# Patient Record
Sex: Male | Born: 1970 | Race: White | Hispanic: No | Marital: Married | Smoking: Former smoker
Health system: Southern US, Community
[De-identification: ages and names within clinical notes are randomized; demographics above are authoritative.]

## PROBLEM LIST (undated history)

## (undated) DIAGNOSIS — Z8679 Personal history of other diseases of the circulatory system: Secondary | ICD-10-CM

## (undated) DIAGNOSIS — Z87820 Personal history of traumatic brain injury: Secondary | ICD-10-CM

## (undated) DIAGNOSIS — K219 Gastro-esophageal reflux disease without esophagitis: Secondary | ICD-10-CM

## (undated) DIAGNOSIS — L509 Urticaria, unspecified: Secondary | ICD-10-CM

## (undated) DIAGNOSIS — R319 Hematuria, unspecified: Secondary | ICD-10-CM

## (undated) DIAGNOSIS — N2889 Other specified disorders of kidney and ureter: Secondary | ICD-10-CM

## (undated) DIAGNOSIS — Z973 Presence of spectacles and contact lenses: Secondary | ICD-10-CM

## (undated) DIAGNOSIS — Z8249 Family history of ischemic heart disease and other diseases of the circulatory system: Secondary | ICD-10-CM

## (undated) HISTORY — DX: Urticaria, unspecified: L50.9

## (undated) HISTORY — PX: TRANSTHORACIC ECHOCARDIOGRAM: SHX275

## (undated) HISTORY — DX: Family history of ischemic heart disease and other diseases of the circulatory system: Z82.49

---
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
## 1999-09-10 HISTORY — PX: OTHER SURGICAL HISTORY: SHX169

## 2003-02-04 ENCOUNTER — Encounter: Payer: Self-pay | Admitting: *Deleted

## 2003-02-04 ENCOUNTER — Encounter: Admission: RE | Admit: 2003-02-04 | Discharge: 2003-02-04 | Payer: Self-pay | Admitting: *Deleted

## 2003-05-09 ENCOUNTER — Encounter: Admission: RE | Admit: 2003-05-09 | Discharge: 2003-05-09 | Payer: Self-pay | Admitting: *Deleted

## 2003-05-09 ENCOUNTER — Encounter: Payer: Self-pay | Admitting: *Deleted

## 2003-11-24 ENCOUNTER — Ambulatory Visit (HOSPITAL_COMMUNITY): Admission: RE | Admit: 2003-11-24 | Discharge: 2003-11-24 | Payer: Self-pay | Admitting: Family Medicine

## 2009-10-03 ENCOUNTER — Encounter: Admission: RE | Admit: 2009-10-03 | Discharge: 2009-10-03 | Payer: Self-pay | Admitting: Family Medicine

## 2014-04-05 ENCOUNTER — Ambulatory Visit (INDEPENDENT_AMBULATORY_CARE_PROVIDER_SITE_OTHER): Payer: Federal, State, Local not specified - PPO | Admitting: Cardiovascular Disease

## 2014-04-05 ENCOUNTER — Encounter: Payer: Self-pay | Admitting: Cardiovascular Disease

## 2014-04-05 VITALS — BP 118/80 | HR 66 | Ht 70.0 in | Wt 142.9 lb

## 2014-04-05 DIAGNOSIS — R002 Palpitations: Secondary | ICD-10-CM

## 2014-04-05 DIAGNOSIS — R0789 Other chest pain: Secondary | ICD-10-CM

## 2014-04-05 NOTE — Progress Notes (Signed)
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 He was referred by his primary care physician for evaluation of palpitations. He has no cardiac risk factors. He does have a brother who had WPW and ablation at [place] at age 62. He has never had a heart attack or stroke. He did have palpitations for years ago and saw a physician at a walk-in clinic the PVCs and asked him to reduce his caffeine intake from 4 Cup a Day. This Resulted in Improvement in His Symptoms. Her Last Week She's Noticed a Fluttering in His Chest X-Ray One to 2 Times A Day Lasting 5-15 Seconds at a Time Associated with Atypical Chest Pain to the Right of the Sternum after Some of These Episodes   No current outpatient prescriptions on file.   No current facility-administered medications for this visit.    Not on File  History   Social History  . Marital Status: Single    Spouse Name: N/A    Number of Children: N/A  . Years of Education: N/A   Occupational History  . Not on file.   Social History Main Topics  . Smoking status: Former Smoker -- 9 years    Types: Cigarettes  . Smokeless tobacco: Former Systems developer    Types: Snuff     Comment: QUIT USING [facility]  6 years ago  . Alcohol Use: 5.0 oz/week    10 drink(s) per week  . Drug Use: No  . Sexual Activity: Not on file   Other Topics Concern  . Not on file   Social History Narrative  . No narrative on file     Review of Systems: General: negative for chills, fever, night sweats or weight changes.  Cardiovascular: negative for chest pain, dyspnea on exertion, edema, orthopnea, palpitations, paroxysmal nocturnal dyspnea or shortness of breath Dermatological: negative for  rash Respiratory: negative for cough or wheezing Urologic: negative for hematuria Abdominal: negative for nausea, vomiting, diarrhea, bright red blood per rectum, melena, or hematemesis Neurologic: negative for visual changes, syncope, or dizziness All other systems reviewed and are otherwise negative except as noted above.    Blood pressure 118/80, pulse 66, height 5\' 10"  (1.778 m), weight 142 lb 14.4 oz (64.819 kg).  General appearance: alert and no distress Neck: no adenopathy, no carotid bruit, no JVD, supple, symmetrical, trachea midline and thyroid not enlarged, symmetric, no tenderness/mass/nodules Lungs: clear to auscultation bilaterally Heart: regular rate and rhythm, S1, S2 normal, no murmur, click, rub or gallop Extremities: extremities normal, atraumatic, no cyanosis or edema and 2+ pedal pulses bilaterally  EKG normal sinus rhythm at 66 without ST or T wave changes. Specifically. There is no evidence of preexcitation  ASSESSMENT AND PLAN:   Palpitations The patient was referred by Raymond Dominguez from [place] for evaluation of palpitations. He had palpitations about 6 weeks ago and saw a physician at a walk-in clinic who told him to reduce his caffeine intake and take him for workup to one cup a day. The symptoms resolved. Over last 2 weeks she's noticed increasing frequency of palpitations which she says are different than his "PVCs". He is building a house and is due to close in 2 weeks. He  drinks one cup of coffee a day and 45-40 alcoholic beverages a week. Contributed to the echocardiogram and a one-month event monitor. I've asked him to reduce his cath caffeine intake and alcohol intake as well. He does say that during this episode which lasted 5-10 seconds at a time 1-3 times a today he gets brief atypical chest pain to the right of the sternum lasting several seconds.      Raymond Harp MD FACP,FACC,FAHA, [facility] 04/05/2014 4:38 PM

## 2014-04-05 NOTE — Patient Instructions (Signed)
Your physician has recommended that you wear an event monitor. Event monitors are medical devices that record the heart's electrical activity. Doctors most often [place] these monitors to diagnose arrhythmias. Arrhythmias are problems with the speed or rhythm of the heartbeat. The monitor is a small, portable device. You can wear one while you do your normal daily activities. This is usually used to diagnose what is causing palpitations/syncope (passing out).   Your physician has requested that you have an echocardiogram. Echocardiography is a painless test that uses sound waves to create images of your heart. It provides your doctor with information about the size and shape of your heart and how well your heart's chambers and valves are working. This procedure takes approximately one hour. There are no restrictions for this procedure.   Your physician recommends that you schedule a follow-up appointment in: 6 WEEKS

## 2014-04-05 NOTE — Assessment & Plan Note (Signed)
The patient was referred by Dr. Hosie Poisson from [place] for evaluation of palpitations. He had palpitations about 6 weeks ago and saw a physician at a walk-in clinic who told him to reduce his caffeine intake and take him for workup to one cup a day. The symptoms resolved. Over last 2 weeks she's noticed increasing frequency of palpitations which she says are different than his "PVCs". He is building a house and is due to close in 2 weeks. He drinks one cup of coffee a day and 92-92 alcoholic beverages a week. Contributed to the echocardiogram and a one-month event monitor. I've asked him to reduce his cath caffeine intake and alcohol intake as well. He does say that during this episode which lasted 5-10 seconds at a time 1-3 times a today he gets brief atypical chest pain to the right of the sternum lasting several seconds.

## 2014-04-14 ENCOUNTER — Ambulatory Visit (HOSPITAL_COMMUNITY)
Admission: RE | Admit: 2014-04-14 | Discharge: 2014-04-14 | Disposition: A | Discharge status: Home / Self Care | Payer: Federal, State, Local not specified - PPO | Source: Ambulatory Visit | Attending: Cardiovascular Disease | Admitting: Cardiovascular Disease

## 2014-04-14 DIAGNOSIS — R002 Palpitations: Secondary | ICD-10-CM | POA: Insufficient documentation

## 2014-04-14 DIAGNOSIS — R072 Precordial pain: Secondary | ICD-10-CM

## 2014-04-14 NOTE — Progress Notes (Signed)
2D Echo Performed 04/14/2014    Marygrace Drought, RCS

## 2014-04-18 ENCOUNTER — Encounter: Payer: Self-pay | Admitting: *Deleted

## 2014-05-19 ENCOUNTER — Telehealth: Payer: Self-pay | Admitting: Cardiovascular Disease

## 2014-05-23 NOTE — Telephone Encounter (Signed)
Closed encounter °

## 2014-07-20 ENCOUNTER — Encounter: Payer: Self-pay | Admitting: Cardiovascular Disease

## 2014-07-20 ENCOUNTER — Ambulatory Visit (INDEPENDENT_AMBULATORY_CARE_PROVIDER_SITE_OTHER): Payer: Federal, State, Local not specified - PPO | Admitting: Cardiovascular Disease

## 2014-07-20 VITALS — BP 110/72 | HR 63 | Ht 70.0 in | Wt 137.0 lb

## 2014-07-20 DIAGNOSIS — R002 Palpitations: Secondary | ICD-10-CM

## 2014-07-20 NOTE — Progress Notes (Signed)
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 He was referred by his primary care physician for evaluation of palpitations. He has no cardiac risk factors. He does have a brother who had WPW and ablation at [place] at age 56. He has never had a heart attack or stroke. He did have palpitations for years ago and saw a physician at a walk-in clinic at which time PVCs or documented  And he was  asked him to reduce his caffeine intake from 4 Cup a Day. This Resulted in Improvement in His Symptoms. Over the week prior to his original offices and he noticed increasing palpitations and he was referred here for further evaluation. A 2-D echocardiogram was performed that was normal and an event monitor showed only sinus rhythm/sinus tachycardia. His palpitations have since reduced in frequency and severity.  No current outpatient prescriptions on file.   No current facility-administered medications for this visit.    Allergies  Allergen Reactions  . Amoxicillin Hives    History   Social History  . Marital Status: Single    Spouse Name: N/A    Number of Children: N/A  . Years of Education: N/A   Occupational History  . Not on file.   Social History Main Topics  . Smoking status: Former Smoker -- 9 years    Types: Cigarettes  . Smokeless tobacco: Former Systems developer    Types: Snuff     Comment: QUIT USING [facility]  6 years ago  . Alcohol Use: 5.0 oz/week    10 drink(s) per week  . Drug Use: No  . Sexual Activity: Not on file   Other Topics Concern  . Not on file   Social History Narrative     Review of Systems: General: negative for chills, fever, night sweats or weight changes.  Cardiovascular:  negative for chest pain, dyspnea on exertion, edema, orthopnea, palpitations, paroxysmal nocturnal dyspnea or shortness of breath Dermatological: negative for rash Respiratory: negative for cough or wheezing Urologic: negative for hematuria Abdominal: negative for nausea, vomiting, diarrhea, bright red blood per rectum, melena, or hematemesis Neurologic: negative for visual changes, syncope, or dizziness All other systems reviewed and are otherwise negative except as noted above.    Blood pressure 110/72, pulse 63, height 5\' 10"  (1.778 m), weight 137 lb (62.143 kg).  General appearance: alert and no distress Neck: no adenopathy, no carotid bruit, no JVD, supple, symmetrical, trachea midline and thyroid not enlarged, symmetric, no tenderness/mass/nodules Lungs: clear to auscultation bilaterally Heart: regular rate and rhythm, S1, S2 normal, no murmur, click, rub or gallop Extremities: extremities normal, atraumatic, no cyanosis or edema  EKG normal sinus rhythm at 63 without ST or T-wave changes. I personally reviewed the EKG  ASSESSMENT AND PLAN:   Palpitations History of palpitations. 2-D echo was performed that was normal. An event monitor was also ordered which she will wear for 2 weeks and I partially reviewed. It was sinus tachycardia without arrhythmias. He did decrease his caffeine intake and has moved. His palpitations have reduced in frequency. There is a family history of WPW but he does not have preexcitation on his baseline 12-lead EKG. I recommended that he return when necessary should he  have recurrent symptoms.      Lorretta Harp MD FACP,FACC,FAHA, [facility] 07/20/2014 12:14 PM

## 2014-07-20 NOTE — Assessment & Plan Note (Signed)
History of palpitations. 2-D echo was performed that was normal. An event monitor was also ordered which she will wear for 2 weeks and I partially reviewed. It was sinus tachycardia without arrhythmias. He did decrease his caffeine intake and has moved. His palpitations have reduced in frequency. There is a family history of WPW but he does not have preexcitation on his baseline 12-lead EKG. I recommended that he return when necessary should he have recurrent symptoms.

## 2014-07-20 NOTE — Patient Instructions (Signed)
Follow up with Dr Berry as needed.  

## 2016-07-01 DIAGNOSIS — K080 Exfoliation of teeth due to systemic causes: Secondary | ICD-10-CM | POA: Diagnosis not present

## 2017-01-29 DIAGNOSIS — K080 Exfoliation of teeth due to systemic causes: Secondary | ICD-10-CM | POA: Diagnosis not present

## 2017-05-19 DIAGNOSIS — L989 Disorder of the skin and subcutaneous tissue, unspecified: Secondary | ICD-10-CM | POA: Diagnosis not present

## 2017-09-22 DIAGNOSIS — Z0000 Encounter for general adult medical examination without abnormal findings: Secondary | ICD-10-CM | POA: Diagnosis not present

## 2017-09-22 DIAGNOSIS — E7800 Pure hypercholesterolemia, unspecified: Secondary | ICD-10-CM | POA: Diagnosis not present

## 2017-09-22 DIAGNOSIS — Z125 Encounter for screening for malignant neoplasm of prostate: Secondary | ICD-10-CM | POA: Diagnosis not present

## 2017-09-24 DIAGNOSIS — K080 Exfoliation of teeth due to systemic causes: Secondary | ICD-10-CM | POA: Diagnosis not present

## 2017-11-25 ENCOUNTER — Other Ambulatory Visit: Payer: Self-pay

## 2017-11-25 ENCOUNTER — Emergency Department (HOSPITAL_COMMUNITY)
Admission: EM | Admit: 2017-11-25 | Discharge: 2017-11-25 | Disposition: A | Discharge status: Home / Self Care | Payer: Federal, State, Local not specified - PPO | Attending: Emergency Medicine | Admitting: Emergency Medicine

## 2017-11-25 DIAGNOSIS — Z87891 Personal history of nicotine dependence: Secondary | ICD-10-CM | POA: Insufficient documentation

## 2017-11-25 DIAGNOSIS — R310 Gross hematuria: Secondary | ICD-10-CM | POA: Insufficient documentation

## 2017-11-25 DIAGNOSIS — R319 Hematuria, unspecified: Secondary | ICD-10-CM | POA: Diagnosis not present

## 2017-11-25 LAB — CBC WITH DIFFERENTIAL/PLATELET
BASOS ABS: 0 10*3/uL (ref 0.0–0.1)
Basophils Relative: 0 %
Eosinophils Absolute: 0.1 10*3/uL (ref 0.0–0.7)
Eosinophils Relative: 1 %
HCT: 39.7 % (ref 39.0–52.0)
Hemoglobin: 12.7 g/dL — ABNORMAL LOW (ref 13.0–17.0)
Lymphocytes Relative: 40 %
Lymphs Abs: 3 10*3/uL (ref 0.7–4.0)
MCH: 29.1 pg (ref 26.0–34.0)
MCHC: 32 g/dL (ref 30.0–36.0)
MCV: 91.1 fL (ref 78.0–100.0)
Monocytes Absolute: 0.5 10*3/uL (ref 0.1–1.0)
Monocytes Relative: 6 %
Neutro Abs: 4 10*3/uL (ref 1.7–7.7)
Neutrophils Relative %: 53 %
PLATELETS: 352 10*3/uL (ref 150–400)
RBC: 4.36 MIL/uL (ref 4.22–5.81)
RDW: 13.6 % (ref 11.5–15.5)
WBC: 7.5 10*3/uL (ref 4.0–10.5)

## 2017-11-25 LAB — COMPREHENSIVE METABOLIC PANEL
ALT: 17 U/L (ref 17–63)
AST: 20 U/L (ref 15–41)
Albumin: 4.4 g/dL (ref 3.5–5.0)
Alkaline Phosphatase: 70 U/L (ref 38–126)
Anion gap: 12 (ref 5–15)
BUN: 10 mg/dL (ref 6–20)
CHLORIDE: 102 mmol/L (ref 101–111)
CO2: 26 mmol/L (ref 22–32)
Calcium: 9.2 mg/dL (ref 8.9–10.3)
Creatinine, Ser: 0.85 mg/dL (ref 0.61–1.24)
GFR calc Af Amer: >60 mL/min (ref >60)
Glucose, Bld: 94 mg/dL (ref 65–99)
Potassium: 4 mmol/L (ref 3.5–5.1)
Sodium: 140 mmol/L (ref 135–145)
Total Bilirubin: 0.7 mg/dL (ref 0.3–1.2)
Total Protein: 7.7 g/dL (ref 6.5–8.1)

## 2017-11-25 LAB — URINALYSIS, ROUTINE W REFLEX MICROSCOPIC: Squamous Epithelial / LPF: NONE SEEN

## 2017-11-25 LAB — PROTIME-INR
INR: 1.01
PROTHROMBIN TIME: 13.2 s (ref 11.4–15.2)

## 2017-11-25 MED ORDER — CIPROFLOXACIN HCL 500 MG PO TABS: 500.0000 mg | ORAL_TABLET | Freq: Once | ORAL | Status: DC

## 2017-11-25 MED ORDER — SULFAMETHOXAZOLE-TRIMETHOPRIM 800-160 MG PO TABS: 2.0000 | ORAL_TABLET | Freq: Two times a day (BID) | ORAL | 0 refills | Status: AC

## 2017-11-25 MED ORDER — SULFAMETHOXAZOLE-TRIMETHOPRIM 800-160 MG PO TABS
1.0000 | ORAL_TABLET | Freq: Once | ORAL | Status: AC
  Administered 2017-11-25: 1 via ORAL
  Filled 2017-11-25: qty 1

## 2017-11-25 NOTE — ED Provider Notes (Signed)
[facility] DEPT Provider Note   CSN: [id number] Arrival date & time: 11/25/17  1611     History   Chief Complaint Chief Complaint  Patient presents with  . Hematuria    HPI   Blood pressure 138/90, pulse 72, temperature 97.8 F (36.6 C), temperature source Oral, resp. rate 18, height 5\' 10"  (1.778 m), weight 63 kg (139 lb), SpO2 100 %.  KEYONDRE HEPBURN is a 47 y.o. male no significant past medical history complaining of blood in urine worsening over the course of the last 12 hours.  He states that he has been urinating freely but it is getting more and more reddish.  He is not anticoagulated.  This is painless.  He did state that he had a moment where he felt like he could not urinate but he passed a clot and this sensation resolved.  He is never seen a urologist.  Past Medical History:  Diagnosis Date  . Atypical chest pain   . Family history of Wolff-Parkinson-White (WPW) syndrome   . Palpitations     Patient Active Problem List   Diagnosis Date Noted  . Palpitations 04/05/2014  . Atypical chest pain 04/05/2014    No past surgical history on file.     Home Medications    Prior to Admission medications   Not on File    Family History Family History  Problem Relation Age of Onset  . Anemia Mother   . Hyperlipidemia Father   . Yves Dill Parkinson White syndrome Brother 58  . Alzheimer's disease Maternal Grandmother   . Heart disease Maternal Grandfather   . Fainting Maternal Grandfather   . Transient ischemic attack Paternal Grandmother   . Transient ischemic attack Paternal Grandfather     Social History Social History   Tobacco Use  . Smoking status: Former Smoker    Years: 9.00    Types: Cigarettes  . Smokeless tobacco: Former Systems developer    Types: Snuff  . Tobacco comment: QUIT USING ALL [employer]  6 years ago  Substance Use Topics  . Alcohol use: Yes    Alcohol/week: 5.0 oz    Types: 10 drink(s) per week  . Drug use: No      Allergies   Amoxicillin   Review of Systems Review of Systems  A complete review of systems was obtained and all systems are negative except as noted in the HPI and PMH.   Physical Exam Updated Vital Signs BP 138/90   Pulse 72   Temp 97.8 F (36.6 C) (Oral)   Resp 18   Ht 5\' 10"  (1.778 m)   Wt 63 kg (139 lb)   SpO2 100%   BMI 19.94 kg/m   Physical Exam  Constitutional: He is oriented to person, place, and time. He appears well-developed and well-nourished. No distress.  HENT:  Head: Normocephalic and atraumatic.  Mouth/Throat: Oropharynx is clear and moist.  Eyes: Conjunctivae and EOM are normal. Pupils are equal, round, and reactive to light.  Neck: Normal range of motion.  Cardiovascular: Normal rate, regular rhythm and intact distal pulses.  Pulmonary/Chest: Effort normal and breath sounds normal.  Abdominal: Soft. There is no tenderness.  Genitourinary:  Genitourinary Comments: No CVA tenderness to percussion bilaterally  Musculoskeletal: Normal range of motion.  Neurological: He is alert and oriented to person, place, and time.  Skin: He is not diaphoretic.  Psychiatric: He has a normal mood and affect.  Nursing note and vitals reviewed.    ED Treatments /  Results  Labs (all labs ordered are listed, but only abnormal results are displayed) Labs Reviewed  URINALYSIS, ROUTINE W REFLEX MICROSCOPIC - Abnormal; Notable for the following components:      Result Value   Color, Urine RED (*)    APPearance TURBID (*)    Glucose, UA   (*)    Value: TEST NOT REPORTED DUE TO COLOR INTERFERENCE OF URINE PIGMENT   Hgb urine dipstick   (*)    Value: TEST NOT REPORTED DUE TO COLOR INTERFERENCE OF URINE PIGMENT   Bilirubin Urine   (*)    Value: TEST NOT REPORTED DUE TO COLOR INTERFERENCE OF URINE PIGMENT   Ketones, ur   (*)    Value: TEST NOT REPORTED DUE TO COLOR INTERFERENCE OF URINE PIGMENT   Protein, ur   (*)    Value: TEST NOT REPORTED DUE TO COLOR  INTERFERENCE OF URINE PIGMENT   Nitrite   (*)    Value: TEST NOT REPORTED DUE TO COLOR INTERFERENCE OF URINE PIGMENT   Leukocytes, UA   (*)    Value: TEST NOT REPORTED DUE TO COLOR INTERFERENCE OF URINE PIGMENT   Bacteria, UA FEW (*)    All other components within normal limits  CBC WITH DIFFERENTIAL/PLATELET  COMPREHENSIVE METABOLIC PANEL  PROTIME-INR    EKG  EKG Interpretation None       Radiology No results found.  Procedures Procedures (including critical care time)  Medications Ordered in ED Medications - No data to display   Initial Impression / Assessment and Plan / ED Course  I have reviewed the triage vital signs and the nursing notes.  Pertinent labs & imaging results that were available during my care of the patient were reviewed by me and considered in my medical decision making (see chart for details).    Vitals:   11/25/17 1634 11/25/17 1635 11/25/17 1955  BP:  127/87 138/90  Pulse:  65 72  Resp:  16 18  Temp:  97.8 F (36.6 C)   TempSrc:  Oral   SpO2:  100% 100%  Weight: 63 kg (139 lb)    Height: 5\' 10"  (1.778 m)      Medications  sulfamethoxazole-trimethoprim (BACTRIM DS,SEPTRA DS) 800-160 MG per tablet 1 tablet (1 tablet Oral Given 11/25/17 2116)    HEBERT DOOLING is 47 y.o. male presenting with gross hematuria worsening over the course of the day.  Patient with no other comorbidities, is on anticoagulated, he has no dysuria however the urinalysis has too numerous to count whites, urine culture pending and patient is started on Bactrim, blood work with no significant abnormality.  Patient understands that he is to return to the ED for signs of urinary retention, fever chills nausea vomiting and he is to follow with urology otherwise.  Evaluation does not show pathology that would require ongoing emergent intervention or inpatient treatment. Pt is hemodynamically stable and mentating appropriately. Discussed findings and plan with patient/guardian,  who agrees with care plan. All questions answered. Return precautions discussed and outpatient follow up given.      Final Clinical Impressions(s) / ED Diagnoses   Final diagnoses:  None    ED Discharge Orders    None       Jedrek Dinovo, Charna Elizabeth 11/25/17 2212    Orlie Dakin, MD 11/26/17 [phone]

## 2017-11-25 NOTE — ED Provider Notes (Signed)
Patient developed painless hematuria starting approximately a month ago which was intermittent.  Earlier today.  Hematuria became much more pronounced he is asymptomatic.  Denies feeling ill no pain anywhere no fever.   Orlie Dakin, MD 11/25/17 2125

## 2017-11-25 NOTE — ED Triage Notes (Signed)
Pt c/o blood in urine that looks like"hawiaan punch". 1 month ago it was light pink but then went away, last night it was light pink as well but kept getting darker throughout the night. Patient went to [facility] triad and MD wanted to refer patient to urologist but referred to ED as they were too close for closing. No pain on urination. No new medications or vitamins.

## 2017-11-25 NOTE — Discharge Instructions (Signed)
Please follow with your primary care doctor in the next 2 days for a check-up. They must obtain records for further management.  ° °Do not hesitate to return to the Emergency Department for any new, worsening or concerning symptoms.  ° °

## 2017-11-27 ENCOUNTER — Other Ambulatory Visit: Payer: Self-pay

## 2017-11-27 ENCOUNTER — Other Ambulatory Visit: Payer: Self-pay | Admitting: Urology

## 2017-11-27 ENCOUNTER — Encounter (HOSPITAL_BASED_OUTPATIENT_CLINIC_OR_DEPARTMENT_OTHER): Payer: Self-pay | Admitting: *Deleted

## 2017-11-27 DIAGNOSIS — D4101 Neoplasm of uncertain behavior of right kidney: Secondary | ICD-10-CM | POA: Diagnosis not present

## 2017-11-27 DIAGNOSIS — R310 Gross hematuria: Secondary | ICD-10-CM | POA: Diagnosis not present

## 2017-11-27 DIAGNOSIS — R319 Hematuria, unspecified: Secondary | ICD-10-CM | POA: Diagnosis not present

## 2017-11-27 LAB — URINE CULTURE: Culture: NO GROWTH

## 2017-11-27 NOTE — Progress Notes (Signed)
SPOKE W/ PT VIA PHONE FOR PRE-OP INTERVIEW.  NPO AFTER MN.  ARRIVE AT [id number].  CURRENT LAB RESULTS IN CHART AND Epic.  WILL TAKE ZANTAC AM DOS W/ SIPS OF WATER .

## 2017-11-27 NOTE — H&P (Signed)
Urology Preoperative H&P   Chief Complaint: Hematuria  History of Present Illness: COBEN GODSHALL is a 47 y.o. male ,followed by Dr. Jeffie Pollock, who first saw a little blood in the urine about a month ago. He had a recurrence that was heavier on Monday night. It became cherry red on Tuesday and his PCP sent him to the ER. The bleeding abated yesterday but it is now heavy again. In the ER he had a UA with TNTC WBC and RBC. He had a mild anemia with a Hgb of 12.7 but his CMP was normal and the culture is negative. he was given bactrim. He has no history of stones, UTI's or GU surgery.   Today, the patient states that his hematuria has resolved and he is urinating without any issues.   Past Medical History:  Diagnosis Date  . Atypical chest pain   . Family history of Wolff-Parkinson-White (WPW) syndrome   . Palpitations     No past surgical history on file.  Allergies:  Allergies  Allergen Reactions  . Amoxicillin Hives    Family History  Problem Relation Age of Onset  . Anemia Mother   . Hyperlipidemia Father   . Yves Dill Parkinson White syndrome Brother 42  . Alzheimer's disease Maternal Grandmother   . Heart disease Maternal Grandfather   . Fainting Maternal Grandfather   . Transient ischemic attack Paternal Grandmother   . Transient ischemic attack Paternal Grandfather     Social History:  reports that he has quit smoking. His smoking use included cigarettes. He quit after 9.00 years of use. He has quit using smokeless tobacco. His smokeless tobacco use included snuff. He reports that he drinks about 5.0 oz of alcohol per week. He reports that he does not use drugs.  ROS: A complete review of systems was performed.  All systems are negative except for pertinent findings as noted.  Physical Exam:  Vital signs in last 24 hours:   Constitutional:  Alert and oriented, No acute distress Cardiovascular: Regular rate and rhythm, No JVD Respiratory: Normal respiratory effort, Lungs clear  bilaterally GI: Abdomen is soft, nontender, nondistended, no abdominal masses GU: No CVA tenderness Lymphatic: No lymphadenopathy Neurologic: Grossly intact, no focal deficits Psychiatric: Normal mood and affect  Laboratory Data:  Recent Labs    11/25/17 2107  WBC 7.5  HGB 12.7*  HCT 39.7  PLT 352    Recent Labs    11/25/17 2107  NA 140  K 4.0  CL 102  GLUCOSE 94  BUN 10  CALCIUM 9.2  CREATININE 0.85     No results found for this or any previous visit (from the past 24 hour(s)). Recent Results (from the past 240 hour(s))  Urine culture     Status: None   Collection Time: 11/25/17  5:33 PM  Result Value Ref Range Status   Specimen Description   Final    URINE, RANDOM Performed at [facility] [address], [place], [place] [address]    Special Requests   Final    NONE Performed at [facility], [place] [address], [place], [place] [address]    Culture   Final    NO GROWTH Performed at [facility], [place] [address], [place], [place] [address]    Report Status 11/27/2017 FINAL  Final    Renal Function: Recent Labs    11/25/17 2107  CREATININE 0.85   Estimated Creatinine Clearance: 94.9 mL/min (by C-G formula based on SCr  of 0.85 mg/dL).  Radiologic Imaging: No results found.  I independently reviewed the above imaging studies.  Assessment and Plan PEDRAM GOODCHILD is a 47 y.o. 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  Risks include, but are not limited to, bleeding, urinary tract infection, stent-related pain, ureteral injury, ureteral stricture disease, ureteral avulsion, MI, CVA, DVT and the inherent risks which general anesthesia.  He voices understanding and wishes to proceed.   Ellison Hughs, MD 11/27/2017, 1:57 PM  [facility] Pager: [phone]

## 2017-11-28 ENCOUNTER — Ambulatory Visit (HOSPITAL_BASED_OUTPATIENT_CLINIC_OR_DEPARTMENT_OTHER): Payer: Federal, State, Local not specified - PPO | Admitting: Anesthesiology

## 2017-11-28 ENCOUNTER — Encounter (HOSPITAL_BASED_OUTPATIENT_CLINIC_OR_DEPARTMENT_OTHER)
Admission: RE | Disposition: A | Discharge status: Home / Self Care | Payer: Self-pay | Source: Ambulatory Visit | Attending: Urology

## 2017-11-28 ENCOUNTER — Other Ambulatory Visit: Payer: Self-pay | Admitting: Urology

## 2017-11-28 ENCOUNTER — Ambulatory Visit (HOSPITAL_BASED_OUTPATIENT_CLINIC_OR_DEPARTMENT_OTHER)
Admission: RE | Admit: 2017-11-28 | Discharge: 2017-11-28 | Disposition: A | Discharge status: Home / Self Care | Payer: Federal, State, Local not specified - PPO | Source: Ambulatory Visit | Attending: Urology | Admitting: Urology

## 2017-11-28 ENCOUNTER — Encounter (HOSPITAL_BASED_OUTPATIENT_CLINIC_OR_DEPARTMENT_OTHER): Payer: Self-pay | Admitting: Anesthesiology

## 2017-11-28 DIAGNOSIS — N2889 Other specified disorders of kidney and ureter: Secondary | ICD-10-CM | POA: Diagnosis not present

## 2017-11-28 DIAGNOSIS — D649 Anemia, unspecified: Secondary | ICD-10-CM | POA: Diagnosis not present

## 2017-11-28 DIAGNOSIS — Z880 Allergy status to penicillin: Secondary | ICD-10-CM | POA: Diagnosis not present

## 2017-11-28 DIAGNOSIS — R002 Palpitations: Secondary | ICD-10-CM | POA: Diagnosis not present

## 2017-11-28 DIAGNOSIS — D4101 Neoplasm of uncertain behavior of right kidney: Secondary | ICD-10-CM | POA: Diagnosis not present

## 2017-11-28 DIAGNOSIS — R310 Gross hematuria: Secondary | ICD-10-CM | POA: Insufficient documentation

## 2017-11-28 DIAGNOSIS — R896 Abnormal cytological findings in specimens from other organs, systems and tissues: Secondary | ICD-10-CM | POA: Diagnosis not present

## 2017-11-28 HISTORY — PX: CYSTOSCOPY/RETROGRADE/URETEROSCOPY: SHX5316

## 2017-11-28 HISTORY — DX: Presence of spectacles and contact lenses: Z97.3

## 2017-11-28 HISTORY — DX: Other specified disorders of kidney and ureter: N28.89

## 2017-11-28 HISTORY — DX: Personal history of other diseases of the circulatory system: Z86.79

## 2017-11-28 HISTORY — DX: Personal history of traumatic brain injury: Z87.820

## 2017-11-28 HISTORY — DX: Hematuria, unspecified: R31.9

## 2017-11-28 HISTORY — DX: Gastro-esophageal reflux disease without esophagitis: K21.9

## 2017-11-28 SURGERY — CYSTOSCOPY/RETROGRADE/URETEROSCOPY
Anesthesia: General | Site: Renal | Laterality: Right

## 2017-11-28 MED ORDER — DEXAMETHASONE SODIUM PHOSPHATE 10 MG/ML IJ SOLN
INTRAMUSCULAR | Status: AC
  Filled 2017-11-28: qty 1

## 2017-11-28 MED ORDER — FENTANYL CITRATE (PF) 100 MCG/2ML IJ SOLN
INTRAMUSCULAR | Status: DC | PRN
  Administered 2017-11-28 (×2): 50 ug via INTRAVENOUS

## 2017-11-28 MED ORDER — PHENAZOPYRIDINE HCL 200 MG PO TABS
200.0000 mg | ORAL_TABLET | Freq: Once | ORAL | Status: AC
  Administered 2017-11-28: 200 mg via ORAL
  Filled 2017-11-28: qty 1

## 2017-11-28 MED ORDER — EPHEDRINE 5 MG/ML INJ
INTRAVENOUS | Status: AC
  Filled 2017-11-28: qty 10

## 2017-11-28 MED ORDER — EPHEDRINE SULFATE-NACL 50-0.9 MG/10ML-% IV SOSY
PREFILLED_SYRINGE | INTRAVENOUS | Status: DC | PRN
  Administered 2017-11-28: 10 mg via INTRAVENOUS

## 2017-11-28 MED ORDER — CIPROFLOXACIN IN D5W 400 MG/200ML IV SOLN
INTRAVENOUS | Status: AC
  Filled 2017-11-28: qty 200

## 2017-11-28 MED ORDER — FENTANYL CITRATE (PF) 100 MCG/2ML IJ SOLN
25.0000 ug | INTRAMUSCULAR | Status: DC | PRN
  Filled 2017-11-28: qty 1

## 2017-11-28 MED ORDER — LIDOCAINE 2% (20 MG/ML) 5 ML SYRINGE
INTRAMUSCULAR | Status: AC
  Filled 2017-11-28: qty 5

## 2017-11-28 MED ORDER — ONDANSETRON HCL 4 MG/2ML IJ SOLN
INTRAMUSCULAR | Status: AC
  Filled 2017-11-28: qty 2

## 2017-11-28 MED ORDER — HYDROCODONE-ACETAMINOPHEN 5-325 MG PO TABS: 1.0000 | ORAL_TABLET | ORAL | 0 refills | Status: DC | PRN

## 2017-11-28 MED ORDER — ACETAMINOPHEN 325 MG PO TABS
ORAL_TABLET | ORAL | Status: DC | PRN
  Administered 2017-11-28: 1000 mg via ORAL

## 2017-11-28 MED ORDER — CIPROFLOXACIN IN D5W 400 MG/200ML IV SOLN
400.0000 mg | Freq: Once | INTRAVENOUS | Status: AC
  Administered 2017-11-28: 400 mg via INTRAVENOUS
  Filled 2017-11-28: qty 200

## 2017-11-28 MED ORDER — FENTANYL CITRATE (PF) 100 MCG/2ML IJ SOLN
INTRAMUSCULAR | Status: AC
  Filled 2017-11-28: qty 2

## 2017-11-28 MED ORDER — PROPOFOL 10 MG/ML IV BOLUS
INTRAVENOUS | Status: DC | PRN
  Administered 2017-11-28: 180 mg via INTRAVENOUS
  Administered 2017-11-28: 20 mg via INTRAVENOUS

## 2017-11-28 MED ORDER — ACETAMINOPHEN 500 MG PO TABS
ORAL_TABLET | ORAL | Status: AC
  Filled 2017-11-28: qty 2

## 2017-11-28 MED ORDER — MIDAZOLAM HCL 2 MG/2ML IJ SOLN
INTRAMUSCULAR | Status: AC
  Filled 2017-11-28: qty 2

## 2017-11-28 MED ORDER — KETOROLAC TROMETHAMINE 30 MG/ML IJ SOLN
INTRAMUSCULAR | Status: AC
  Filled 2017-11-28: qty 1

## 2017-11-28 MED ORDER — IOHEXOL 300 MG/ML  SOLN
INTRAMUSCULAR | Status: DC | PRN
  Administered 2017-11-28: 9 mL

## 2017-11-28 MED ORDER — SODIUM CHLORIDE 0.9 % IR SOLN
Status: DC | PRN
  Administered 2017-11-28: 4000 mL via INTRAVESICAL

## 2017-11-28 MED ORDER — PHENAZOPYRIDINE HCL 100 MG PO TABS
ORAL_TABLET | ORAL | Status: AC
  Filled 2017-11-28: qty 2

## 2017-11-28 MED ORDER — PROPOFOL 10 MG/ML IV BOLUS
INTRAVENOUS | Status: AC
  Filled 2017-11-28: qty 40

## 2017-11-28 MED ORDER — PHENAZOPYRIDINE HCL 200 MG PO TABS: 200.0000 mg | ORAL_TABLET | Freq: Three times a day (TID) | ORAL | 0 refills | Status: AC | PRN

## 2017-11-28 MED ORDER — DEXAMETHASONE SODIUM PHOSPHATE 10 MG/ML IJ SOLN
INTRAMUSCULAR | Status: DC | PRN
  Administered 2017-11-28: 10 mg via INTRAVENOUS

## 2017-11-28 MED ORDER — ONDANSETRON HCL 4 MG/2ML IJ SOLN
INTRAMUSCULAR | Status: DC | PRN
  Administered 2017-11-28: 4 mg via INTRAVENOUS

## 2017-11-28 MED ORDER — LIDOCAINE 2% (20 MG/ML) 5 ML SYRINGE
INTRAMUSCULAR | Status: DC | PRN
  Administered 2017-11-28: 100 mg via INTRAVENOUS

## 2017-11-28 MED ORDER — MIDAZOLAM HCL 5 MG/5ML IJ SOLN
INTRAMUSCULAR | Status: DC | PRN
  Administered 2017-11-28: 2 mg via INTRAVENOUS

## 2017-11-28 MED ORDER — ONDANSETRON HCL 4 MG PO TABS: 4.0000 mg | ORAL_TABLET | Freq: Every day | ORAL | 1 refills | Status: DC | PRN

## 2017-11-28 MED ORDER — LACTATED RINGERS IV SOLN
INTRAVENOUS | Status: DC
  Administered 2017-11-28: 10:00:00 via INTRAVENOUS
  Filled 2017-11-28: qty 1000

## 2017-11-28 SURGICAL SUPPLY — 29 items
BAG DRAIN URO-CYSTO SKYTR STRL (DRAIN) ×2 IMPLANT
BASKET STONE 1.7 NGAGE (UROLOGICAL SUPPLIES) ×2 IMPLANT
BASKET ZERO TIP NITINOL 2.4FR (BASKET) ×2 IMPLANT
BENZOIN TINCTURE PRP APPL 2/3 (GAUZE/BANDAGES/DRESSINGS) IMPLANT
CATH URET 5FR 28IN OPEN ENDED (CATHETERS) ×2 IMPLANT
CATH URET DUAL LUMEN 6-10FR 50 (CATHETERS) ×2 IMPLANT
CLOTH BEACON ORANGE TIMEOUT ST (SAFETY) ×2 IMPLANT
FIBER LASER FLEXIVA 365 (UROLOGICAL SUPPLIES) IMPLANT
FIBER LASER TRAC TIP (UROLOGICAL SUPPLIES) IMPLANT
GLOVE BIO SURGEON STRL SZ 6.5 (GLOVE) ×2 IMPLANT
GLOVE BIO SURGEON STRL SZ7.5 (GLOVE) ×2 IMPLANT
GLOVE BIOGEL PI IND STRL 6.5 (GLOVE) ×2 IMPLANT
GLOVE BIOGEL PI INDICATOR 6.5 (GLOVE) ×2
GOWN STRL REUS W/TWL LRG LVL3 (GOWN DISPOSABLE) ×2 IMPLANT
GOWN STRL REUS W/TWL XL LVL3 (GOWN DISPOSABLE) ×2 IMPLANT
GUIDEWIRE ANG ZIPWIRE 038X150 (WIRE) ×2 IMPLANT
GUIDEWIRE STR DUAL SENSOR (WIRE) IMPLANT
GUIDEWIRE ZIPWRE .038 STRAIGHT (WIRE) ×4 IMPLANT
INFUSOR MANOMETER BAG 3000ML (MISCELLANEOUS) ×2 IMPLANT
IV NS 1000ML (IV SOLUTION) ×1
IV NS 1000ML BAXH (IV SOLUTION) ×1 IMPLANT
IV NS IRRIG 3000ML ARTHROMATIC (IV SOLUTION) ×2 IMPLANT
KIT TURNOVER CYSTO (KITS) ×2 IMPLANT
MANIFOLD NEPTUNE II (INSTRUMENTS) ×2 IMPLANT
NS IRRIG 500ML POUR BTL (IV SOLUTION) ×2 IMPLANT
PACK CYSTO (CUSTOM PROCEDURE TRAY) ×2 IMPLANT
STRIP CLOSURE SKIN 1/2X4 (GAUZE/BANDAGES/DRESSINGS) IMPLANT
SYRINGE 10CC LL (SYRINGE) ×2 IMPLANT
TUBE CONNECTING 12X1/4 (SUCTIONS) ×2 IMPLANT

## 2017-11-28 NOTE — Transfer of Care (Signed)
Immediate Anesthesia Transfer of Care Note  Patient: Raymond Dominguez  Procedure(s) Performed: CYSTOSCOPY/RETROGRADE/URETEROSCOPY BIOPSY (Right Renal)  Patient Location: PACU  Anesthesia Type:General  Level of Consciousness: awake, alert  and oriented  Airway & Oxygen Therapy: Patient Spontanous Breathing and Patient connected to nasal cannula oxygen  Post-op Assessment: Report given to RN  Post vital signs: Reviewed and stable  Last Vitals:  Vitals Value Taken Time  BP 113/74 11/28/2017 12:35 PM  Temp 36.6 C 11/28/2017 12:30 PM  Pulse 67 11/28/2017 12:37 PM  Resp 10 11/28/2017 12:37 PM  SpO2 100 % 11/28/2017 12:37 PM  Vitals shown include unvalidated device data.  Last Pain:  Vitals:   11/28/17 0900  TempSrc: Oral         Complications: No apparent anesthesia complications

## 2017-11-28 NOTE — Anesthesia Postprocedure Evaluation (Signed)
Anesthesia Post Note  Patient: Raymond Dominguez  Procedure(s) Performed: CYSTOSCOPY/RETROGRADE/URETEROSCOPY BIOPSY (Right Renal)     Patient location during evaluation: PACU Anesthesia Type: General Level of consciousness: awake and alert Pain management: pain level controlled Vital Signs Assessment: post-procedure vital signs reviewed and stable Respiratory status: spontaneous breathing, nonlabored ventilation and respiratory function stable Cardiovascular status: blood pressure returned to baseline and stable Postop Assessment: no apparent nausea or vomiting Anesthetic complications: no    Last Vitals:  Vitals:   11/28/17 1245 11/28/17 1300  BP: 113/78 117/81  Pulse: 70 68  Resp: 14 13  Temp:    SpO2: 100% 100%    Last Pain:  Vitals:   11/28/17 1300  TempSrc:   PainSc: 0-No pain                 Marsean Elkhatib,W. EDMOND

## 2017-11-28 NOTE — Discharge Instructions (Signed)
°  Post Anesthesia Home Care Instructions  Activity: Get plenty of rest for the remainder of the day. A responsible individual must stay with you for 24 hours following the procedure.  For the next 24 hours, DO NOT: -Drive a car -Operate machinery -Drink alcoholic beverages -Take any medication unless instructed by your physician -Make any legal decisions or sign important papers.  Meals: Start with liquid foods such as gelatin or soup. Progress to regular foods as tolerated. Avoid greasy, spicy, heavy foods. If nausea and/or vomiting occur, drink only clear liquids until the nausea and/or vomiting subsides. Call your physician if vomiting continues.  Special Instructions/Symptoms: Your throat may feel dry or sore from the anesthesia or the breathing tube placed in your throat during surgery. If this causes discomfort, gargle with warm salt water. The discomfort should disappear within 24 hours.  If you had a scopolamine patch placed behind your ear for the management of post- operative nausea and/or vomiting:  1. The medication in the patch is effective for 72 hours, after which it should be removed.  Wrap patch in a tissue and discard in the trash. Wash hands thoroughly with soap and water. 2. You may remove the patch earlier than 72 hours if you experience unpleasant side effects which may include dry mouth, dizziness or visual disturbances. 3. Avoid touching the patch. Wash your hands with soap and water after contact with the patch.   CYSTOSCOPY HOME CARE INSTRUCTIONS  Activity: Rest for the remainder of the day.  Do not drive or operate equipment today.  You may resume normal activities in one to two days as instructed by your physician.   Meals: Drink plenty of liquids and eat light foods such as gelatin or soup this evening.  You may return to a normal meal plan tomorrow.  Return to Work: You may return to work in one to two days or as instructed by your physician.  Special  Instructions / Symptoms: Call your physician if any of these symptoms occur:   -persistent or heavy bleeding  -bleeding which continues after first few urination  -large blood clots that are difficult to pass  -urine stream diminishes or stops completely  -fever equal to or higher than 101 degrees Farenheit.  -cloudy urine with a strong, foul odor  -severe pain   You may feel some burning pain when you urinate.  This should disappear with time.  Applying moist heat to the lower abdomen or a hot tub bath may help relieve the pain. \   

## 2017-11-28 NOTE — Anesthesia Procedure Notes (Signed)
Procedure Name: LMA Insertion Date/Time: 11/28/2017 12:31 PM Performed by: Bonney Aid, CRNA Pre-anesthesia Checklist: Patient identified, Emergency Drugs available, Suction available and Patient being monitored Patient Re-evaluated:Patient Re-evaluated prior to induction Oxygen Delivery Method: Circle system utilized Preoxygenation: Pre-oxygenation with 100% oxygen Induction Type: IV induction Ventilation: Mask ventilation without difficulty LMA: LMA inserted LMA Size: 5.0 Number of attempts: 1 Airway Equipment and Method: Bite block Placement Confirmation: positive ETCO2 Tube secured with: Tape Dental Injury: Teeth and Oropharynx as per pre-operative assessment

## 2017-11-28 NOTE — Anesthesia Preprocedure Evaluation (Addendum)
Anesthesia Evaluation  Patient identified by MRN, date of birth, ID band Patient awake    Reviewed: Allergy & Precautions, H&P , NPO status , Patient's Chart, lab work & pertinent test results  Airway Mallampati: II  TM Distance: >3 FB Neck ROM: Full    Dental no notable dental hx. (+) Teeth Intact, Dental Advisory Given   Pulmonary neg pulmonary ROS, former smoker,    Pulmonary exam normal breath sounds clear to auscultation       Cardiovascular negative cardio ROS   Rhythm:Regular Rate:Normal     Neuro/Psych negative neurological ROS  negative psych ROS   GI/Hepatic Neg liver ROS, GERD  Medicated and Controlled,  Endo/Other  negative endocrine ROS  Renal/GU negative Renal ROS  negative genitourinary   Musculoskeletal   Abdominal   Peds  Hematology negative hematology ROS (+)   Anesthesia Other Findings   Reproductive/Obstetrics negative OB ROS                            Anesthesia Physical Anesthesia Plan  ASA: II  Anesthesia Plan: General   Post-op Pain Management:    Induction: Intravenous  PONV Risk Score and Plan: 3 and Ondansetron, Dexamethasone and Midazolam  Airway Management Planned: LMA  Additional Equipment:   Intra-op Plan:   Post-operative Plan: Extubation in OR  Informed Consent: I have reviewed the patients History and Physical, chart, labs and discussed the procedure including the risks, benefits and alternatives for the proposed anesthesia with the patient or authorized representative who has indicated his/her understanding and acceptance.   Dental advisory given  Plan Discussed with: CRNA  Anesthesia Plan Comments:         Anesthesia Quick Evaluation

## 2017-11-28 NOTE — Op Note (Signed)
Operative Note  Preoperative diagnosis:  1.  2.9 cm enhancing right renal mass  2.  Gross hematuria  Postoperative diagnosis: 1.  2.9 cm enhancing right renal mass  2.  Gross hematuria  Procedure(s): 1.  Cystoscopy 2.  Right retrograde pyelogram with intraoperative interpretation of fluoroscopic imaging 3.  Diagnostic right ureteroscopy with biopsy and right renal pelvis washing  Surgeon: Ellison Hughs, MD  Assistants: None  Anesthesia: General LMA  Complications: None  EBL: Less than 5 mL  Specimens: 1.  Right renal pelvis biopsy 2.  Right renal pelvis washings sent for cytology  Drains/Catheters: 1.  None  Intraoperative findings:   1.  Solitary right collecting system with no filling defects or dilation involving the right ureter or right renal pelvis seen on retrograde pyelogram  2.  There was clot-like tissue emanating from a right mid pole calyx with no signs of papillary tumor/other lesions involving the right renal pelvis, the remaining right renal calyces or the right ureter   Indication:  Raymond Dominguez is a 47 y.o. male with a one-week history of worsening gross hematuria and a 2.9 cm right renal mass found on recent CT abdomen/pelvis with and without contrast that is concerning for a possible renal cell carcinoma versus urothelial carcinoma.  He is here today for the above procedures, voices understanding of the risk, benefits and alternatives and wishes to proceed.  Description of procedure:  After informed consent was obtained, the patient was brought to the operating room and general LMA anesthesia was administered. The patient was then placed in the dorsolithotomy position and prepped and draped in usual sterile fashion. A timeout was performed. A 23 French rigid cystoscope was then inserted into the urethral meatus and advanced into the bladder under direct vision. A complete bladder survey revealed no intravesical pathology.  A 5 French open-ended  catheter was then inserted into the right ureteral orifice and a retrograde pyelogram was obtained, with the findings listed above.  A Glidewire was then advanced through the lumen of the ureteral catheter and up to the right renal pelvis, under fluoroscopic guidance.  An additional Glidewire was advanced up to the right renal pelvis, in a similar fashion.  A flexible ureteroscope was then advanced over the working wire and into position within the right renal pelvis, under fluoroscopic guidance.  Full inspection of the right renal pelvis and its associated calyces revealed only small clot-like tissue involving the right mid pole calyx with no signs of active bleeding, papillary tumor or other concerning lesions.  There were no lesions observed along the entire length of the right ureter.  The clot-like tissue within the right mid pole calyx was grasped with an engage basket, extracted and sent to pathology for permanent section.  Right renal pelvis washings were obtained through the ureteroscope and sent to pathology for cytologic analysis.  The flexible ureteroscope was then removed under direct vision, again, identifying no ureteral lesions.  The ureteroscopy was very atraumatic and did not require a ureteral stent.  The additional Glidewire was then removed, under fluoroscopic guidance.  The cystoscope was then reinserted and the patient's bladder was drained.  He tolerated the procedure well and was transferred to the postanesthesia unit in stable condition.  Plan: Follow-up in 10 days.  The results of his tissue biopsy will dictate the surgical approach to his right renal mass.  We will consider obtaining an MRI for additional imaging to further characterize the right renal lesion in question.  Cc: Jenny Reichmann  Jeffie Pollock, MD

## 2017-12-02 ENCOUNTER — Encounter (HOSPITAL_BASED_OUTPATIENT_CLINIC_OR_DEPARTMENT_OTHER): Payer: Self-pay | Admitting: Urology

## 2017-12-03 DIAGNOSIS — R8271 Bacteriuria: Secondary | ICD-10-CM | POA: Diagnosis not present

## 2017-12-03 DIAGNOSIS — R310 Gross hematuria: Secondary | ICD-10-CM | POA: Diagnosis not present

## 2017-12-03 DIAGNOSIS — M545 Low back pain: Secondary | ICD-10-CM | POA: Diagnosis not present

## 2017-12-03 DIAGNOSIS — D4101 Neoplasm of uncertain behavior of right kidney: Secondary | ICD-10-CM | POA: Diagnosis not present

## 2017-12-03 DIAGNOSIS — R350 Frequency of micturition: Secondary | ICD-10-CM | POA: Diagnosis not present

## 2017-12-04 DIAGNOSIS — J111 Influenza due to unidentified influenza virus with other respiratory manifestations: Secondary | ICD-10-CM | POA: Diagnosis not present

## 2017-12-04 DIAGNOSIS — R509 Fever, unspecified: Secondary | ICD-10-CM | POA: Diagnosis not present

## 2017-12-04 DIAGNOSIS — L270 Generalized skin eruption due to drugs and medicaments taken internally: Secondary | ICD-10-CM | POA: Diagnosis not present

## 2017-12-12 ENCOUNTER — Other Ambulatory Visit (HOSPITAL_COMMUNITY): Payer: Self-pay | Admitting: Urology

## 2017-12-12 DIAGNOSIS — D4101 Neoplasm of uncertain behavior of right kidney: Secondary | ICD-10-CM | POA: Diagnosis not present

## 2017-12-12 DIAGNOSIS — R310 Gross hematuria: Secondary | ICD-10-CM | POA: Diagnosis not present

## 2017-12-12 DIAGNOSIS — M545 Low back pain: Secondary | ICD-10-CM | POA: Diagnosis not present

## 2017-12-19 ENCOUNTER — Ambulatory Visit (HOSPITAL_COMMUNITY)
Admission: RE | Admit: 2017-12-19 | Discharge: 2017-12-19 | Disposition: A | Discharge status: Home / Self Care | Payer: Federal, State, Local not specified - PPO | Source: Ambulatory Visit | Attending: Urology | Admitting: Urology

## 2017-12-19 DIAGNOSIS — N2889 Other specified disorders of kidney and ureter: Secondary | ICD-10-CM | POA: Diagnosis not present

## 2017-12-19 DIAGNOSIS — D4101 Neoplasm of uncertain behavior of right kidney: Secondary | ICD-10-CM | POA: Insufficient documentation

## 2017-12-19 MED ORDER — GADOBENATE DIMEGLUMINE 529 MG/ML IV SOLN
15.0000 mL | Freq: Once | INTRAVENOUS | Status: AC | PRN
  Administered 2017-12-19: 12 mL via INTRAVENOUS

## 2017-12-30 ENCOUNTER — Other Ambulatory Visit: Payer: Self-pay | Admitting: Urology

## 2018-01-05 DIAGNOSIS — D4101 Neoplasm of uncertain behavior of right kidney: Secondary | ICD-10-CM | POA: Diagnosis not present

## 2018-01-28 NOTE — Patient Instructions (Addendum)
Raymond Dominguez  01/28/2018   Your procedure is scheduled on: 02-04-18   Report to [facility] Main  Entrance    Report to Admitting at 10:00 AM    Call this number if you have problems the morning of surgery [phone]   Remember: Do not eat food or drink liquids :After Midnight.     Take these medicines the morning of surgery with A SIP OF WATER: None                                You may not have any metal on your body including hair pins and              piercings  Do not wear jewelry, lotions, powders or deodorant             Men may shave face and neck.   Do not bring valuables to the hospital. [place].  Contacts, dentures or bridgework may not be worn into surgery.  Leave suitcase in the car. After surgery it may be brought to your room.   Special Instructions: N/A              Please read over the following fact sheets you were given: _____________________________________________________________________          [facility] - Preparing for Surgery Before surgery, you can play an important role.  Because skin is not sterile, your skin needs to be as free of germs as possible.  You can reduce the number of germs on your skin by washing with CHG (chlorahexidine gluconate) soap before surgery.  CHG is an antiseptic cleaner which kills germs and bonds with the skin to continue killing germs even after washing. Please DO NOT use if you have an allergy to CHG or antibacterial soaps.  If your skin becomes reddened/irritated stop using the CHG and inform your nurse when you arrive at Short Stay. Do not shave (including legs and underarms) for at least 48 hours prior to the first CHG shower.  You may shave your face/neck. Please follow these instructions carefully:  1.  Shower with CHG Soap the night before surgery and the  morning of Surgery.  2.  If you choose to wash your hair, wash your hair first as  usual with your  normal  shampoo.  3.  After you shampoo, rinse your hair and body thoroughly to remove the  shampoo.                           4.  Use CHG as you would any other liquid soap.  You can apply chg directly  to the skin and wash                       Gently with a scrungie or clean washcloth.  5.  Apply the CHG Soap to your body ONLY FROM THE NECK DOWN.   Do not use on face/ open                           Wound or open sores. Avoid contact with eyes, ears mouth and genitals (  private parts).                       Wash face,  Genitals (private parts) with your normal soap.             6.  Wash thoroughly, paying special attention to the area where your surgery  will be performed.  7.  Thoroughly rinse your body with warm water from the neck down.  8.  DO NOT shower/wash with your normal soap after using and rinsing off  the CHG Soap.                9.  Pat yourself dry with a clean towel.            10.  Wear clean pajamas.            11.  Place clean sheets on your bed the night of your first shower and do not  sleep with pets. Day of Surgery : Do not apply any lotions/deodorants the morning of surgery.  Please wear clean clothes to the hospital/surgery center.  FAILURE TO FOLLOW THESE INSTRUCTIONS MAY RESULT IN THE CANCELLATION OF YOUR SURGERY PATIENT SIGNATURE_________________________________  NURSE SIGNATURE__________________________________  ________________________________________________________________________  WHAT IS A BLOOD TRANSFUSION? Blood Transfusion Information  A transfusion is the replacement of blood or some of its parts. Blood is made up of multiple cells which provide different functions.  Red blood cells carry oxygen and are used for blood loss replacement.  White blood cells fight against infection.  Platelets control bleeding.  Plasma helps clot blood.  Other blood products are available for specialized needs, such as hemophilia or other clotting  disorders. BEFORE THE TRANSFUSION  Who gives blood for transfusions?   Healthy volunteers who are fully evaluated to make sure their blood is safe. This is blood bank blood. Transfusion therapy is the safest it has ever been in the practice of medicine. Before blood is taken from a donor, a complete history is taken to make sure that person has no history of diseases nor engages in risky social behavior (examples are intravenous drug use or sexual activity with multiple partners). The donor's travel history is screened to minimize risk of transmitting infections, such as malaria. The donated blood is tested for signs of infectious diseases, such as HIV and hepatitis. The blood is then tested to be sure it is compatible with you in order to minimize the chance of a transfusion reaction. If you or a relative donates blood, this is often done in anticipation of surgery and is not appropriate for emergency situations. It takes many days to process the donated blood. RISKS AND COMPLICATIONS Although transfusion therapy is very safe and saves many lives, the main dangers of transfusion include:   Getting an infectious disease.  Developing a transfusion reaction. This is an allergic reaction to something in the blood you were given. Every precaution is taken to prevent this. The decision to have a blood transfusion has been considered carefully by your caregiver before blood is given. Blood is not given unless the benefits outweigh the risks. AFTER THE TRANSFUSION  Right after receiving a blood transfusion, you will usually feel much better and more energetic. This is especially true if your red blood cells have gotten low (anemic). The transfusion raises the level of the red blood cells which carry oxygen, and this usually causes an energy increase.  The nurse administering the transfusion will monitor you carefully for complications. HOME CARE  INSTRUCTIONS  No special instructions are needed after a  transfusion. You may find your energy is better. Speak with your caregiver about any limitations on activity for underlying diseases you may have. SEEK MEDICAL CARE IF:   Your condition is not improving after your transfusion.  You develop redness or irritation at the intravenous (IV) site. SEEK IMMEDIATE MEDICAL CARE IF:  Any of the following symptoms occur over the next 12 hours:  Shaking chills.  You have a temperature by mouth above 102 F (38.9 C), not controlled by medicine.  Chest, back, or muscle pain.  People around you feel you are not acting correctly or are confused.  Shortness of breath or difficulty breathing.  Dizziness and fainting.  You get a rash or develop hives.  You have a decrease in urine output.  Your urine turns a dark color or changes to pink, red, or brown. Any of the following symptoms occur over the next 10 days:  You have a temperature by mouth above 102 F (38.9 C), not controlled by medicine.  Shortness of breath.  Weakness after normal activity.  The white part of the eye turns yellow (jaundice).  You have a decrease in the amount of urine or are urinating less often.  Your urine turns a dark color or changes to pink, red, or brown. Document Released: 08/23/2000 Document Revised: 11/18/2011 Document Reviewed: 04/11/2008 [facility] Patient Information 2014 [place], [place].  _______________________________________________________________________

## 2018-01-29 ENCOUNTER — Encounter (HOSPITAL_COMMUNITY)
Admission: RE | Admit: 2018-01-29 | Discharge: 2018-01-29 | Disposition: A | Discharge status: Home / Self Care | Payer: Federal, State, Local not specified - PPO | Source: Ambulatory Visit | Attending: Urology | Admitting: Urology

## 2018-01-29 ENCOUNTER — Encounter (HOSPITAL_COMMUNITY): Payer: Self-pay

## 2018-01-29 ENCOUNTER — Other Ambulatory Visit: Payer: Self-pay

## 2018-01-29 DIAGNOSIS — Z01812 Encounter for preprocedural laboratory examination: Secondary | ICD-10-CM | POA: Diagnosis not present

## 2018-01-29 DIAGNOSIS — N289 Disorder of kidney and ureter, unspecified: Secondary | ICD-10-CM | POA: Insufficient documentation

## 2018-01-29 LAB — CBC
HCT: 40.2 % (ref 39.0–52.0)
HEMOGLOBIN: 13 g/dL (ref 13.0–17.0)
MCH: 29.3 pg (ref 26.0–34.0)
MCHC: 32.3 g/dL (ref 30.0–36.0)
MCV: 90.7 fL (ref 78.0–100.0)
PLATELETS: 353 10*3/uL (ref 150–400)
RBC: 4.43 MIL/uL (ref 4.22–5.81)
RDW: 14.1 % (ref 11.5–15.5)
WBC: 6.9 10*3/uL (ref 4.0–10.5)

## 2018-01-29 LAB — BASIC METABOLIC PANEL
Anion gap: 11 (ref 5–15)
BUN: 14 mg/dL (ref 6–20)
CHLORIDE: 100 mmol/L — AB (ref 101–111)
CO2: 27 mmol/L (ref 22–32)
CREATININE: 0.81 mg/dL (ref 0.61–1.24)
Calcium: 9.4 mg/dL (ref 8.9–10.3)
GFR calc Af Amer: >60 mL/min (ref >60)
GFR calc non Af Amer: >60 mL/min (ref >60)
GLUCOSE: 94 mg/dL (ref 65–99)
POTASSIUM: 4 mmol/L (ref 3.5–5.1)
Sodium: 138 mmol/L (ref 135–145)

## 2018-01-29 LAB — ABO/RH: ABO/RH(D): O NEG

## 2018-01-30 LAB — URINE CULTURE: Culture: NO GROWTH

## 2018-02-03 NOTE — Anesthesia Preprocedure Evaluation (Addendum)
Anesthesia Evaluation  Patient identified by MRN, date of birth, ID band Patient awake    Reviewed: Allergy & Precautions, NPO status , Patient's Chart, lab work & pertinent test results  Airway Mallampati: II  TM Distance: >3 FB Neck ROM: Full    Dental no notable dental hx. (+) Dental Advisory Given, Teeth Intact   Pulmonary neg pulmonary ROS, former smoker,    Pulmonary exam normal breath sounds clear to auscultation       Cardiovascular negative cardio ROS Normal cardiovascular exam Rhythm:Regular Rate:Normal  Echo 04/14/2014 - Left ventricle: Systolic function was normal. The estimated ejection fraction was in the range of 60% to 65%. .   Neuro/Psych negative neurological ROS  negative psych ROS   GI/Hepatic Neg liver ROS, GERD  ,  Endo/Other    Renal/GU negative Renal ROS     Musculoskeletal negative musculoskeletal ROS (+)   Abdominal   Peds negative pediatric ROS (+)  Hematology negative hematology ROS (+)   Anesthesia Other Findings   Reproductive/Obstetrics                             Lab Results  Component Value Date   CREATININE 0.81 01/29/2018   BUN 14 01/29/2018   NA 138 01/29/2018   K 4.0 01/29/2018   CL 100 (L) 01/29/2018   CO2 27 01/29/2018    Lab Results  Component Value Date   WBC 6.9 01/29/2018   HGB 13.0 01/29/2018   HCT 40.2 01/29/2018   MCV 90.7 01/29/2018   PLT 353 01/29/2018    Anesthesia Physical Anesthesia Plan  ASA: II  Anesthesia Plan: General   Post-op Pain Management:    Induction: Intravenous  PONV Risk Score and Plan: Treatment may vary due to age or medical condition and Ondansetron  Airway Management Planned: Oral ETT  Additional Equipment:   Intra-op Plan:   Post-operative Plan: Extubation in OR  Informed Consent: I have reviewed the patients History and Physical, chart, labs and discussed the procedure including the  risks, benefits and alternatives for the proposed anesthesia with the patient or authorized representative who has indicated his/her understanding and acceptance.   Dental advisory given  Plan Discussed with: CRNA  Anesthesia Plan Comments:         Anesthesia Quick Evaluation

## 2018-02-04 ENCOUNTER — Encounter (HOSPITAL_COMMUNITY)
Admission: RE | Disposition: A | Discharge status: Home / Self Care | Payer: Self-pay | Source: Ambulatory Visit | Attending: Urology

## 2018-02-04 ENCOUNTER — Other Ambulatory Visit: Payer: Self-pay | Admitting: Family Medicine

## 2018-02-04 ENCOUNTER — Other Ambulatory Visit: Payer: Self-pay

## 2018-02-04 ENCOUNTER — Encounter (HOSPITAL_COMMUNITY): Payer: Self-pay | Admitting: *Deleted

## 2018-02-04 ENCOUNTER — Ambulatory Visit (HOSPITAL_COMMUNITY): Payer: Federal, State, Local not specified - PPO | Admitting: Anesthesiology

## 2018-02-04 ENCOUNTER — Observation Stay (HOSPITAL_COMMUNITY)
Admission: RE | Admit: 2018-02-04 | Discharge: 2018-02-05 | Disposition: A | Discharge status: Home / Self Care | Payer: Federal, State, Local not specified - PPO | Source: Ambulatory Visit | Attending: Urology | Admitting: Urology

## 2018-02-04 DIAGNOSIS — C641 Malignant neoplasm of right kidney, except renal pelvis: Secondary | ICD-10-CM | POA: Diagnosis not present

## 2018-02-04 DIAGNOSIS — D4101 Neoplasm of uncertain behavior of right kidney: Secondary | ICD-10-CM | POA: Diagnosis not present

## 2018-02-04 DIAGNOSIS — K219 Gastro-esophageal reflux disease without esophagitis: Secondary | ICD-10-CM | POA: Diagnosis not present

## 2018-02-04 DIAGNOSIS — N2889 Other specified disorders of kidney and ureter: Secondary | ICD-10-CM | POA: Diagnosis present

## 2018-02-04 HISTORY — PX: ROBOT ASSISTED LAPAROSCOPIC NEPHRECTOMY: SHX5140

## 2018-02-04 LAB — HEMOGLOBIN AND HEMATOCRIT, BLOOD
HCT: 35.5 % — ABNORMAL LOW (ref 39.0–52.0)
HEMOGLOBIN: 11.2 g/dL — AB (ref 13.0–17.0)

## 2018-02-04 LAB — TYPE AND SCREEN
ABO/RH(D): O NEG
ANTIBODY SCREEN: NEGATIVE

## 2018-02-04 SURGERY — NEPHRECTOMY, RADICAL, ROBOT-ASSISTED, LAPAROSCOPIC, ADULT
Anesthesia: General | Site: Abdomen | Laterality: Right

## 2018-02-04 MED ORDER — DOCUSATE SODIUM 100 MG PO CAPS: 100.0000 mg | ORAL_CAPSULE | Freq: Two times a day (BID) | ORAL | 0 refills | Status: AC

## 2018-02-04 MED ORDER — ROCURONIUM BROMIDE 10 MG/ML (PF) SYRINGE
PREFILLED_SYRINGE | INTRAVENOUS | Status: AC
  Filled 2018-02-04: qty 5

## 2018-02-04 MED ORDER — LACTATED RINGERS IV SOLN
INTRAVENOUS | Status: DC
  Administered 2018-02-04 (×3): via INTRAVENOUS

## 2018-02-04 MED ORDER — PROPOFOL 10 MG/ML IV BOLUS
INTRAVENOUS | Status: AC
  Filled 2018-02-04: qty 20

## 2018-02-04 MED ORDER — ATROPINE SULFATE 1 MG/10ML IJ SOSY
PREFILLED_SYRINGE | INTRAMUSCULAR | Status: AC
  Filled 2018-02-04: qty 10

## 2018-02-04 MED ORDER — ACETAMINOPHEN 325 MG PO TABS
650.0000 mg | ORAL_TABLET | ORAL | Status: DC | PRN
Start: 2018-02-04 — End: 2018-02-05

## 2018-02-04 MED ORDER — HYDROCODONE-ACETAMINOPHEN 5-325 MG PO TABS: 1.0000 | ORAL_TABLET | ORAL | 0 refills | Status: AC | PRN

## 2018-02-04 MED ORDER — GLYCOPYRROLATE 0.2 MG/ML IJ SOLN
INTRAMUSCULAR | Status: DC | PRN
  Administered 2018-02-04 (×2): 0.2 mg via INTRAVENOUS

## 2018-02-04 MED ORDER — LACTATED RINGERS IR SOLN
Status: DC | PRN
  Administered 2018-02-04: 1000 mL

## 2018-02-04 MED ORDER — KETAMINE HCL 10 MG/ML IJ SOLN
INTRAMUSCULAR | Status: AC
  Filled 2018-02-04: qty 1

## 2018-02-04 MED ORDER — ONDANSETRON HCL 4 MG PO TABS: 4.0000 mg | ORAL_TABLET | Freq: Three times a day (TID) | ORAL | 0 refills | Status: AC | PRN

## 2018-02-04 MED ORDER — LIDOCAINE 2% (20 MG/ML) 5 ML SYRINGE
INTRAMUSCULAR | Status: AC
  Filled 2018-02-04: qty 5

## 2018-02-04 MED ORDER — ROCURONIUM BROMIDE 100 MG/10ML IV SOLN
INTRAVENOUS | Status: DC | PRN
  Administered 2018-02-04: 10 mg via INTRAVENOUS
  Administered 2018-02-04: 50 mg via INTRAVENOUS
  Administered 2018-02-04 (×2): 20 mg via INTRAVENOUS
  Administered 2018-02-04: 10 mg via INTRAVENOUS
  Administered 2018-02-04: 30 mg via INTRAVENOUS
  Administered 2018-02-04: 10 mg via INTRAVENOUS

## 2018-02-04 MED ORDER — MIDAZOLAM HCL 2 MG/2ML IJ SOLN
INTRAMUSCULAR | Status: AC
  Filled 2018-02-04: qty 2

## 2018-02-04 MED ORDER — ONDANSETRON HCL 4 MG/2ML IJ SOLN: 4.0000 mg | INTRAMUSCULAR | Status: DC | PRN

## 2018-02-04 MED ORDER — CEFAZOLIN SODIUM-DEXTROSE 1-4 GM/50ML-% IV SOLN
1.0000 g | Freq: Three times a day (TID) | INTRAVENOUS | Status: AC
  Administered 2018-02-04 – 2018-02-05 (×3): 1 g via INTRAVENOUS
  Filled 2018-02-04 (×3): qty 50

## 2018-02-04 MED ORDER — FENTANYL CITRATE (PF) 100 MCG/2ML IJ SOLN
INTRAMUSCULAR | Status: DC | PRN
  Administered 2018-02-04: 100 ug via INTRAVENOUS
  Administered 2018-02-04 (×3): 50 ug via INTRAVENOUS

## 2018-02-04 MED ORDER — DEXAMETHASONE SODIUM PHOSPHATE 10 MG/ML IJ SOLN
INTRAMUSCULAR | Status: AC
  Filled 2018-02-04: qty 1

## 2018-02-04 MED ORDER — HYDROMORPHONE HCL 1 MG/ML IJ SOLN: 0.5000 mg | INTRAMUSCULAR | Status: DC | PRN

## 2018-02-04 MED ORDER — BUPIVACAINE-EPINEPHRINE (PF) 0.25% -1:200000 IJ SOLN
INTRAMUSCULAR | Status: AC
  Filled 2018-02-04: qty 30

## 2018-02-04 MED ORDER — DIPHENHYDRAMINE HCL 50 MG/ML IJ SOLN: 12.5000 mg | Freq: Four times a day (QID) | INTRAMUSCULAR | Status: DC | PRN

## 2018-02-04 MED ORDER — MEPERIDINE HCL 50 MG/ML IJ SOLN: 6.2500 mg | INTRAMUSCULAR | Status: DC | PRN

## 2018-02-04 MED ORDER — PROMETHAZINE HCL 25 MG/ML IJ SOLN: 6.2500 mg | INTRAMUSCULAR | Status: DC | PRN

## 2018-02-04 MED ORDER — SUGAMMADEX SODIUM 200 MG/2ML IV SOLN
INTRAVENOUS | Status: AC
  Filled 2018-02-04: qty 2

## 2018-02-04 MED ORDER — KETAMINE HCL 10 MG/ML IJ SOLN
INTRAMUSCULAR | Status: DC | PRN
  Administered 2018-02-04: 30 mg via INTRAVENOUS

## 2018-02-04 MED ORDER — HYDROMORPHONE HCL 1 MG/ML IJ SOLN
INTRAMUSCULAR | Status: AC
  Filled 2018-02-04: qty 1

## 2018-02-04 MED ORDER — OXYCODONE HCL 5 MG PO TABS
5.0000 mg | ORAL_TABLET | ORAL | Status: DC | PRN
  Administered 2018-02-04 – 2018-02-05 (×3): 5 mg via ORAL
  Filled 2018-02-04 (×3): qty 1

## 2018-02-04 MED ORDER — FAMOTIDINE 20 MG PO TABS
20.0000 mg | ORAL_TABLET | Freq: Every day | ORAL | Status: DC
  Administered 2018-02-04 – 2018-02-05 (×2): 20 mg via ORAL
  Filled 2018-02-04 (×2): qty 1

## 2018-02-04 MED ORDER — ACETAMINOPHEN 10 MG/ML IV SOLN
INTRAVENOUS | Status: AC
  Administered 2018-02-04: 1000 mg via INTRAVENOUS
  Filled 2018-02-04: qty 100

## 2018-02-04 MED ORDER — CEFAZOLIN SODIUM-DEXTROSE 2-4 GM/100ML-% IV SOLN
2.0000 g | Freq: Once | INTRAVENOUS | Status: AC
  Administered 2018-02-04: 2 g via INTRAVENOUS
  Filled 2018-02-04: qty 100

## 2018-02-04 MED ORDER — ONDANSETRON HCL 4 MG/2ML IJ SOLN
INTRAMUSCULAR | Status: DC | PRN
  Administered 2018-02-04: 4 mg via INTRAVENOUS

## 2018-02-04 MED ORDER — EPHEDRINE SULFATE 50 MG/ML IJ SOLN
INTRAMUSCULAR | Status: AC
  Filled 2018-02-04: qty 1

## 2018-02-04 MED ORDER — BUPIVACAINE-EPINEPHRINE 0.25% -1:200000 IJ SOLN
INTRAMUSCULAR | Status: DC | PRN
  Administered 2018-02-04: 30 mL

## 2018-02-04 MED ORDER — PROPOFOL 10 MG/ML IV BOLUS
INTRAVENOUS | Status: DC | PRN
  Administered 2018-02-04: 120 mg via INTRAVENOUS

## 2018-02-04 MED ORDER — DIPHENHYDRAMINE HCL 12.5 MG/5ML PO ELIX: 12.5000 mg | ORAL_SOLUTION | Freq: Four times a day (QID) | ORAL | Status: DC | PRN

## 2018-02-04 MED ORDER — HYDROMORPHONE HCL 1 MG/ML IJ SOLN
0.2500 mg | INTRAMUSCULAR | Status: DC | PRN
Start: 2018-02-04 — End: 2018-02-04

## 2018-02-04 MED ORDER — STERILE WATER FOR IRRIGATION IR SOLN
Status: DC | PRN
  Administered 2018-02-04: 1000 mL

## 2018-02-04 MED ORDER — DEXTROSE-NACL 5-0.45 % IV SOLN
INTRAVENOUS | Status: DC
  Administered 2018-02-04 – 2018-02-05 (×2): via INTRAVENOUS

## 2018-02-04 MED ORDER — FENTANYL CITRATE (PF) 250 MCG/5ML IJ SOLN
INTRAMUSCULAR | Status: AC
  Filled 2018-02-04: qty 5

## 2018-02-04 MED ORDER — ONDANSETRON HCL 4 MG/2ML IJ SOLN
INTRAMUSCULAR | Status: AC
  Filled 2018-02-04: qty 2

## 2018-02-04 MED ORDER — DEXAMETHASONE SODIUM PHOSPHATE 10 MG/ML IJ SOLN
INTRAMUSCULAR | Status: DC | PRN
  Administered 2018-02-04: 10 mg via INTRAVENOUS

## 2018-02-04 MED ORDER — LIDOCAINE HCL (CARDIAC) PF 100 MG/5ML IV SOSY
PREFILLED_SYRINGE | INTRAVENOUS | Status: DC | PRN
  Administered 2018-02-04: 60 mg via INTRAVENOUS

## 2018-02-04 MED ORDER — MIDAZOLAM HCL 5 MG/5ML IJ SOLN
INTRAMUSCULAR | Status: DC | PRN
  Administered 2018-02-04: 2 mg via INTRAVENOUS

## 2018-02-04 MED ORDER — ACETAMINOPHEN 10 MG/ML IV SOLN
1000.0000 mg | Freq: Once | INTRAVENOUS | Status: DC | PRN
  Administered 2018-02-04: 1000 mg via INTRAVENOUS

## 2018-02-04 MED ORDER — HYDROCODONE-ACETAMINOPHEN 7.5-325 MG PO TABS: 1.0000 | ORAL_TABLET | Freq: Once | ORAL | Status: DC | PRN

## 2018-02-04 MED ORDER — SUGAMMADEX SODIUM 200 MG/2ML IV SOLN
INTRAVENOUS | Status: DC | PRN
  Administered 2018-02-04: 150 mg via INTRAVENOUS

## 2018-02-04 SURGICAL SUPPLY — 60 items
APPLICATOR SURGIFLO ENDO (HEMOSTASIS) ×2 IMPLANT
BAG LAPAROSCOPIC 12 15 PORT 16 (BASKET) IMPLANT
BAG RETRIEVAL 12/15 (BASKET)
CHLORAPREP W/TINT 26ML (MISCELLANEOUS) ×2 IMPLANT
CLIP SUT LAPRA TY ABSORB (SUTURE) ×4 IMPLANT
CLIP VESOLOCK LG 6/CT PURPLE (CLIP) ×4 IMPLANT
CLIP VESOLOCK MED LG 6/CT (CLIP) ×6 IMPLANT
CLIP VESOLOCK XL 6/CT (CLIP) ×2 IMPLANT
COVER SURGICAL LIGHT HANDLE (MISCELLANEOUS) ×2 IMPLANT
COVER TIP SHEARS 8 DVNC (MISCELLANEOUS) ×1 IMPLANT
COVER TIP SHEARS 8MM DA VINCI (MISCELLANEOUS) ×1
DECANTER SPIKE VIAL GLASS SM (MISCELLANEOUS) ×2 IMPLANT
DRAIN CHANNEL 15F RND FF 3/16 (WOUND CARE) ×2 IMPLANT
DRAPE ARM DVNC X/XI (DISPOSABLE) ×4 IMPLANT
DRAPE COLUMN DVNC XI (DISPOSABLE) ×1 IMPLANT
DRAPE DA VINCI XI ARM (DISPOSABLE) ×4
DRAPE DA VINCI XI COLUMN (DISPOSABLE) ×1
DRAPE INCISE IOBAN 66X45 STRL (DRAPES) ×2 IMPLANT
DRAPE LAPAROSCOPIC ABDOMINAL (DRAPES) ×2 IMPLANT
DRAPE SHEET LG 3/4 BI-LAMINATE (DRAPES) ×2 IMPLANT
ELECT PENCIL ROCKER SW 15FT (MISCELLANEOUS) ×2 IMPLANT
ELECT REM PT RETURN 15FT ADLT (MISCELLANEOUS) ×2 IMPLANT
EVACUATOR SILICONE 100CC (DRAIN) ×2 IMPLANT
GLOVE BIO SURGEON STRL SZ 6.5 (GLOVE) ×2 IMPLANT
GLOVE BIOGEL M STRL SZ7.5 (GLOVE) ×4 IMPLANT
GOWN STRL REUS W/TWL LRG LVL3 (GOWN DISPOSABLE) ×2 IMPLANT
GOWN STRL REUS W/TWL XL LVL3 (GOWN DISPOSABLE) ×4 IMPLANT
HEMOSTAT SURGICEL 4X8 (HEMOSTASIS) ×2 IMPLANT
IRRIG SUCT STRYKERFLOW 2 WTIP (MISCELLANEOUS) ×2
IRRIGATION SUCT STRKRFLW 2 WTP (MISCELLANEOUS) ×1 IMPLANT
KIT BASIN OR (CUSTOM PROCEDURE TRAY) ×2 IMPLANT
MARKER SKIN DUAL TIP RULER LAB (MISCELLANEOUS) ×2 IMPLANT
NEEDLE INSUFFLATION 14GA 120MM (NEEDLE) ×2 IMPLANT
NS IRRIG 1000ML POUR BTL (IV SOLUTION) IMPLANT
PORT ACCESS TROCAR AIRSEAL 12 (TROCAR) ×2 IMPLANT
PORT ACCESS TROCAR AIRSEAL 5M (TROCAR) ×2
POSITIONER SURGICAL ARM (MISCELLANEOUS) ×6 IMPLANT
RELOAD STAPLER WHITE 60MM (STAPLE) IMPLANT
SEAL CANN UNIV 5-8 DVNC XI (MISCELLANEOUS) ×3 IMPLANT
SEAL XI 5MM-8MM UNIVERSAL (MISCELLANEOUS) ×3
SET TRI-LUMEN FLTR TB AIRSEAL (TUBING) ×2 IMPLANT
SOLUTION ELECTROLUBE (MISCELLANEOUS) ×2 IMPLANT
STAPLE ECHEON FLEX 60 POW ENDO (STAPLE) IMPLANT
STAPLER RELOAD WHITE 60MM (STAPLE)
SURGIFLO W/THROMBIN 8M KIT (HEMOSTASIS) ×2 IMPLANT
SUT ETHILON 3 0 PS 1 (SUTURE) ×2 IMPLANT
SUT PDS AB 2-0 CT2 27 (SUTURE) IMPLANT
SUT VIC AB 1 CT1 27 (SUTURE) ×5
SUT VIC AB 1 CT1 27XBRD ANTBC (SUTURE) ×5 IMPLANT
SUT VIC AB 3-0 SH 27 (SUTURE) ×2
SUT VIC AB 3-0 SH 27XBRD (SUTURE) ×2 IMPLANT
SUT VICRYL 0 UR6 27IN ABS (SUTURE) ×2 IMPLANT
SUT VLOC BARB 180 ABS3/0GR12 (SUTURE) ×4
SUTURE VLOC BRB 180 ABS3/0GR12 (SUTURE) ×2 IMPLANT
TOWEL OR 17X26 10 PK STRL BLUE (TOWEL DISPOSABLE) ×2 IMPLANT
TOWEL OR NON WOVEN STRL DISP B (DISPOSABLE) ×2 IMPLANT
TRAY FOLEY MTR SLVR 16FR STAT (SET/KITS/TRAYS/PACK) ×2 IMPLANT
TRAY LAPAROSCOPIC (CUSTOM PROCEDURE TRAY) ×2 IMPLANT
TROCAR BLADELESS OPT 5 100 (ENDOMECHANICALS) ×2 IMPLANT
WATER STERILE IRR 1000ML POUR (IV SOLUTION) ×2 IMPLANT

## 2018-02-04 NOTE — Transfer of Care (Signed)
Immediate Anesthesia Transfer of Care Note  Patient: Raymond Dominguez  Procedure(s) Performed: XI ROBOTIC ASSISTED LAPAROSCOPIC PARTIAL NEPHRECTOMY/ POSSIBLE RADICAL NEPHRECTOMY (Right Abdomen)  Patient Location: PACU  Anesthesia Type:General  Level of Consciousness: sedated, patient cooperative and responds to stimulation  Airway & Oxygen Therapy: Patient Spontanous Breathing and Patient connected to face mask oxygen  Post-op Assessment: Report given to RN and Post -op Vital signs reviewed and stable  Post vital signs: Reviewed and stable  Last Vitals:  Vitals Value Taken Time  BP 115/76 02/04/2018  3:00 PM  Temp 36.4 C 02/04/2018  2:55 PM  Pulse 72 02/04/2018  3:02 PM  Resp 14 02/04/2018  3:02 PM  SpO2 100 % 02/04/2018  3:02 PM  Vitals shown include unvalidated device data.  Last Pain: There were no vitals filed for this visit.       Complications: No apparent anesthesia complications

## 2018-02-04 NOTE — Progress Notes (Signed)
Pt. Stated he felt "the urge to urinate" and was having abdominal pain. Upon assessment, abdomen was not distended and foley catheter was draining well. Bladder scan performed and resulted in 0 mL of urine. Pain medicine given. Will continue to monitor pt. Closely.

## 2018-02-04 NOTE — Discharge Instructions (Signed)

## 2018-02-04 NOTE — Anesthesia Procedure Notes (Signed)
Procedure Name: Intubation Date/Time: 02/04/2018 11:51 AM Performed by: Glory Buff, CRNA Pre-anesthesia Checklist: Patient identified, Emergency Drugs available, Suction available and Patient being monitored Patient Re-evaluated:Patient Re-evaluated prior to induction Oxygen Delivery Method: Circle system utilized Preoxygenation: Pre-oxygenation with 100% oxygen Induction Type: IV induction Ventilation: Mask ventilation without difficulty Laryngoscope Size: Miller and 3 Grade View: Grade I Tube type: Oral Tube size: 7.5 mm Number of attempts: 1 Airway Equipment and Method: Stylet and Oral airway Placement Confirmation: ETT inserted through vocal cords under direct vision,  positive ETCO2 and breath sounds checked- equal and bilateral Secured at: 21 cm Tube secured with: Tape Dental Injury: Teeth and Oropharynx as per pre-operative assessment

## 2018-02-04 NOTE — H&P (Signed)
Urology Preoperative H&P   Chief Complaint: Right renal mass  History of Present Illness: Raymond Dominguez is a 47 y.o. male with a heterogenously enhancing right renal mass with features concerning for renal cell carcinoma.  The mass was initially identified in March 2019 during an evaluation of intermittent episodes of gross hematuria.  The patient had a subsequent right ureteroscopy that grossly confirmed that the mass was not urothelial in nature.  His ureteroscopy was complicated by an episode of right pyelonephritis, that has since resolved.  MRI abd wo/w (12/19/17) confirmed a solid and heterogenously enhancing right renal mass suspicious for malignancy.  Today, he denies flank pain, fever/chills, dysuria or hematuria.    Past Medical History:  Diagnosis Date  . Family history of Wolff-Parkinson-White (WPW) syndrome    brother had ablation for WPW at age 50  . GERD (gastroesophageal reflux disease)   . Hematuria   . History of concussion    11-27-2017 per pt as teen w/ no residual  . History of palpitations in adulthood normal echo 07/ 2015,  event monitor 2015  showed ST occaional   11-27-2017 per pt evaluated for palpitations in 2015 by dr berry , cardiologist (note in epic), was told situtional anxiety and stress, has not had any issues since  . Renal mass, right   . Wears glasses     Past Surgical History:  Procedure Laterality Date  . CYSTOSCOPY/RETROGRADE/URETEROSCOPY Right 11/28/2017   Procedure: CYSTOSCOPY/RETROGRADE/URETEROSCOPY BIOPSY;  Surgeon: Ceasar Mons, MD;  Location: [facility];  Service: Urology;  Laterality: Right;  . REMOVAL CYST LEFT HAND  2001  . TRANSTHORACIC ECHOCARDIOGRAM  04-14-2014  dr berry   ef 60-65%/ trivial AR and TR  . WISDOM TOOTH EXTRACTION  1989    Allergies:  Allergies  Allergen Reactions  . Amoxicillin Hives    Has patient had a PCN reaction causing immediate rash, facial/tongue/throat swelling, SOB or  lightheadedness with hypotension: No Has patient had a PCN reaction causing severe rash involving mucus membranes or skin necrosis: Yes Has patient had a PCN reaction that required hospitalization: No Has patient had a PCN reaction occurring within the last 10 years: Yes If all of the above answers are "NO", then may proceed with Cephalosporin use.   Marland Kitchen Avocado Nausea And Vomiting  . Bactrim [Sulfamethoxazole-Trimethoprim]     Headaches, fever, and chills   . Ciprofloxacin Hives    Family History  Problem Relation Age of Onset  . Anemia Mother   . Hyperlipidemia Father   . Yves Dill Parkinson White syndrome Brother 55  . Alzheimer's disease Maternal Grandmother   . Heart disease Maternal Grandfather   . Fainting Maternal Grandfather   . Transient ischemic attack Paternal Grandmother   . Transient ischemic attack Paternal Grandfather     Social History:  reports that he quit smoking about 10 years ago. His smoking use included cigarettes. He quit after 20.00 years of use. He quit smokeless tobacco use about 8 years ago. His smokeless tobacco use included snuff. He reports that he drinks about 9.6 - 10.8 oz of alcohol per week. He reports that he does not use drugs.  ROS: A complete review of systems was performed.  All systems are negative except for pertinent findings as noted.  Physical Exam:  Vital signs in last 24 hours:   Constitutional:  Alert and oriented, No acute distress Cardiovascular: Regular rate and rhythm, No JVD Respiratory: Normal respiratory effort, Lungs clear bilaterally GI: Abdomen is soft, nontender,  nondistended, no abdominal masses GU: No CVA tenderness Lymphatic: No lymphadenopathy Neurologic: Grossly intact, no focal deficits Psychiatric: Normal mood and affect  Laboratory Data:  No results for input(s): WBC, HGB, HCT, PLT in the last 72 hours.  No results for input(s): NA, K, CL, GLUCOSE, BUN, CALCIUM, CREATININE in the last 72 hours.  Invalid  input(s): CO3   No results found for this or any previous visit (from the past 24 hour(s)). Recent Results (from the past 240 hour(s))  Urine culture     Status: None   Collection Time: 01/29/18  2:26 PM  Result Value Ref Range Status   Specimen Description   Final    URINE, CLEAN CATCH Performed at [facility] [address] [place], [place] [address]    Special Requests   Final    NONE Performed at [facility], [place] [address], [place], [place] [address]    Culture   Final    NO GROWTH Performed at [facility], [place] [address] [place], [place] [address]    Report Status 01/30/2018 FINAL  Final    Renal Function: Recent Labs    01/29/18 1444  CREATININE 0.81   Estimated Creatinine Clearance: 97.5 mL/min (by C-G formula based on SCr of 0.81 mg/dL).  Radiologic Imaging: No results found.  I independently reviewed the above imaging studies.  Assessment and Plan Raymond Dominguez is a 47 y.o. male with an enhancing 2.5 cm right renal mass with features concerning for RCC  I reviewed imaging results and films with the patient personally. Discussed that the right renal mass in question has features concerning for malignancy. Explained the natural history of presumed renal cell carcinoma. Reviewed the AUA guidelines for evaluation and treatment of the small renal mass. Active surveillance, in situ tumor ablation, partial and radical nephrectomy discussed. The risks of robotic partial nephrectomy were discussed in detail including but not limited to: negative pathology, open conversion, completion nephrectomy, infection of the urinary tract/skin/abdominal cavity, VTE, MI/CVA, lymphatic leak, injury to adjacent solid/hollow viscus organs, bleeding requiring a blood transfusion, catastrophic bleeding, hernia formation, need for postoperative angioembolization, urinary leak requiring stent/drain, and other imponderables. He voices understanding  and wishes to proceed with robotic right partial nephrectomy    Ellison Hughs, MD 02/04/2018, 10:11 AM  [facility] Pager: [phone]

## 2018-02-04 NOTE — Op Note (Signed)
Operative Note  Preoperative diagnosis:  1.  2.5 cm right renal mass  Postoperative diagnosis: 1.  2.5 cm right renal mass  Procedure(s): 1.  Robotic assisted laparoscopic right partial nephrectomy  Surgeon: Raymond Hughs, MD  Assistants:  Debbrah Alar, [facility].  An assistant was required for this surgical procedure.  The duties of the assistant included but were not limited to suctioning, passing suture, camera manipulation, retraction.  This procedure would not be able to be performed without an Environmental consultant.    Anesthesia:  General  Complications:  None  EBL:  100 mL  Fluids: 2000 mL crystaloid  Specimens: 1. Right renal mass  Drains/Catheters: 1.  Foley catheter  Intraoperative findings:   1. Grossly negative surgical margins following mass excision 2. The collecting system was entered and repaired with a single interrupted 3-0 vicryl   3. Renorrhaphy hemostatic  Indication:  Raymond Dominguez is a 47 y.o. male with a heterogenously enhancing right renal mass with features concerning for renal cell carcinoma.  The mass was initially identified in March 2019 during an evaluation of intermittent episodes of gross hematuria.  The patient had a subsequent right ureteroscopy that grossly confirmed that the mass was not urothelial in nature.  His ureteroscopy was complicated by an episode of right pyelonephritis, that has since resolved.  MRI abd wo/w (12/19/17) confirmed a solid and heterogenously enhancing right renal mass suspicious for malignancy.  Today, he denies flank pain, fever/chills, dysuria or hematuria.    He has been consented for the above procedures, voices understanding wishes to proceed.  Description of procedure:  After informed consent was signed, the patient was taken back to the operating room and properly anesthetized.  The patient was then placed in the left lateral decubitus position with all pressure points padded.  The abdomen was then prepped and draped in  the usual sterile fashion.  A time-out was then performed.    An 8 mm incision was then made lateral to the right rectus muscle at the level of the right 12th rib.  A Veress needle was then used to access the abdominal cavity.  A saline drop test showed no signs of obstruction and aspiration of the Veress needle revealed no blood or sucus.  The abdominal cavity was then insufflated to 15 mmHg.  An 8 mm robotic trocar was then atraumatically inserted into the abdominal cavity.  The robotic camera was then inserted through the port and inspection of the abdominal cavity revealed no evidence of adjacent organ or vessel injury.  We then placed three additional 8 mm robotic ports, a 12 mm assistant port and a 5 mm subxiphoid port in such as fashion as to triangulate the right renal hilum.  The robot was then docked into postion.   Using a combination of blunt and cold scissors dissection, the hepatic attachments were released from the abdominal sidewall.  A locking grasper was then inserted through the 5 mm sub-xyphoid port and used to retract the posterior surface of the liver more cephalad.  The white line of Toldt along the ascending colon was then incised, allowing [place] to reflect the colon medially and expose the anterior surface of the right kidney.  The duodenum was then Kocherized medially, which abruptly led [place] to identification of the inferior vena cava.    Once the colon was adequately mobilized, we moved to the lower pole and identified the gonadal vein and ureter.  The gonadal vein was then left running parallel to the vena cava  and the right ureter was reflected anteriorly.  Using cautious cautery, the overlying perihilar attachments were then released.  This yielded visualization of the renal hilum, which included a single right renal vein and a single right renal artery.  The perilymphatic tissue surrounding the right renal artery were carefully released so that the right renal artery was fully  encircled.    We next turned our attention to defatting the kidney.  An anterior incision along Gerota's fascia was created and the kidney was fully mobilized.   The renal mass is identified at the anterior midpole.  Using intraoperative ultrasound, the tumor was then carefully evaluated and demonstrated heterogenous echogenicity compared to the rest of the renal parenchyma. The resection margin was marked to allow for wide excision of the renal mass.  The renal hilum was once again identified and a bulldog clamp was placed.    Using cold scissors, the right renal mass was then carefully excised, leaving a grossly negative margin.  Excision of the mass appeared complete with no tumor grossly remaining.  There was a small area of collecting system entry at the inferior portion of the excision that was later repaired with a single interrupted 3-0 Vicryl. The tumor was then placed in the right lower quadrant, to be retrieved following repair of the renal defect.  A running 3-0 V-lock suture was then used to reapproximate the resection bed.  Tension was placed with hemo-lock clips.   The right renal capsule was then reapproximated using a 1-0 Vicryl suture on a CT1 needle in an interuppted configuration, using hemo-lock clips as a buttress.  The bulldog was then released, which warm ischemia time totaled 27 minutes.  The renal parenchyma reperfused appropriately following bulldog clamp removal. Once hemostasis was achieved and the renal bed was irrigated, surgicel and flowseal was then placed over the renorrhaphy.  Gerota's fascia was then reapproximated with a running v-lok suture.  The renal mass was retrieved and placed in an Endo Catch bag.  The mass was extracted through the 12 mm assistant port.  The fascia of the assistant port was then reapproximated with a 0 Vicryl suture.    The abdomen was desufflated with all ports removed.  The skin was then reapproximated using 4-0 Monocryl and dressed with  dermabond.  The patient was then replaced in the supine position and was awakened from anesthesia without complications.  Warm Ischemia Time: 27 minutes  Plan:  Monitor on the floor overnight with bedrest until the AM.

## 2018-02-05 ENCOUNTER — Encounter (HOSPITAL_COMMUNITY): Payer: Self-pay | Admitting: Urology

## 2018-02-05 DIAGNOSIS — C641 Malignant neoplasm of right kidney, except renal pelvis: Secondary | ICD-10-CM | POA: Diagnosis not present

## 2018-02-05 LAB — BASIC METABOLIC PANEL
ANION GAP: 7 (ref 5–15)
BUN: 11 mg/dL (ref 6–20)
CO2: 26 mmol/L (ref 22–32)
Calcium: 8.3 mg/dL — ABNORMAL LOW (ref 8.9–10.3)
Chloride: 104 mmol/L (ref 101–111)
Creatinine, Ser: 0.93 mg/dL (ref 0.61–1.24)
Glucose, Bld: 164 mg/dL — ABNORMAL HIGH (ref 65–99)
POTASSIUM: 4.1 mmol/L (ref 3.5–5.1)
Sodium: 137 mmol/L (ref 135–145)

## 2018-02-05 LAB — HEMOGLOBIN AND HEMATOCRIT, BLOOD
HEMATOCRIT: 35.4 % — AB (ref 39.0–52.0)
HEMOGLOBIN: 11.3 g/dL — AB (ref 13.0–17.0)

## 2018-02-05 NOTE — Progress Notes (Signed)
1 Day Post-Op Subjective: NAE. Pain controlled.  He had one episode of nausea w/o vomiting while coughing.  Tolerating fluids this AM.  Doing well.   Objective: Vital signs in last 24 hours: Temp:  [97.3 F (36.3 C)-98.2 F (36.8 C)] 97.8 F (36.6 C) (05/30 0355) Pulse Rate:  [55-74] 57 (05/30 0355) Resp:  [12-16] 14 (05/30 0355) BP: (115-144)/(76-91) 118/76 (05/30 0355) SpO2:  [100 %] 100 % (05/30 0355)  Intake/Output from previous day: 05/29 0701 - 05/30 0700 In: 4495.8 [I.V.:4295.8; IV Piggyback:200] Out: 2070 [Urine:1970; Blood:100]  Intake/Output this shift: No intake/output data recorded.  Physical Exam:  General: Alert and oriented CV: RRR, palpable distal pulses Lungs: CTAB, equal chest rise Abdomen: Soft, NTND, no rebound or guarding Incisions: c/d/i Gu: Foley draining clear-yellow urine Ext: NT, No erythema  Lab Results: Recent Labs    02/04/18 1507 02/05/18 0542  HGB 11.2* 11.3*  HCT 35.5* 35.4*   BMET Recent Labs    02/05/18 0542  NA 137  K 4.1  CL 104  CO2 26  GLUCOSE 164*  BUN 11  CREATININE 0.93  CALCIUM 8.3*     Studies/Results: No results found.  Assessment/Plan: POD 1 s/p right RAPNx  -OOBTC and ambulate -d/c Foley -Advance diet as tolerated -Likely home this afternoon.   LOS: 0 days   Ellison Hughs, MD [facility] Pager: [phone]  02/05/2018, 11:58 AM

## 2018-02-05 NOTE — Anesthesia Postprocedure Evaluation (Signed)
Anesthesia Post Note  Patient: Raymond Dominguez  Procedure(s) Performed: XI ROBOTIC ASSISTED LAPAROSCOPIC PARTIAL NEPHRECTOMY/ POSSIBLE RADICAL NEPHRECTOMY (Right Abdomen)     Patient location during evaluation: PACU Anesthesia Type: General Level of consciousness: awake and alert Pain management: pain level controlled Vital Signs Assessment: post-procedure vital signs reviewed and stable Respiratory status: spontaneous breathing, nonlabored ventilation, respiratory function stable and patient connected to nasal cannula oxygen Cardiovascular status: blood pressure returned to baseline and stable Postop Assessment: no apparent nausea or vomiting Anesthetic complications: no    Last Vitals:  Vitals:   02/04/18 2342 02/05/18 0355  BP: 122/80 118/76  Pulse: 62 (!) 57  Resp: 14 14  Temp: 36.8 C 36.6 C  SpO2: 100% 100%    Last Pain:  Vitals:   02/05/18 0530  TempSrc:   PainSc: [place]

## 2018-02-05 NOTE — Progress Notes (Signed)
Patient discharged to home w/ family. Given all belongings, instructions, prescriptions. Escorted to pov via w/c.

## 2018-02-05 NOTE — Discharge Summary (Signed)
Date of admission: 02/04/2018  Date of discharge: 02/05/2018  Admission diagnosis: 2.5 cm right renal mass  Discharge diagnosis: Same    History and Physical: For full details, please see admission history and physical. Briefly, Raymond Dominguez is a 47 y.o. year old patient with with a solid and enhancing right renal mass seen on CT and MRI. He underwent a robotic assisted right partial nephrectomy on 02/04/18 to address his right renal mass.  Hospital Course: Routine post-op course following RALPNx.  The patient was tolerating a regular diet, ambulating, urinating and his pain was controlled by POD 1.   Physical Exam:  General: Alert and oriented CV: RRR, palpable distal pulses Lungs: CTAB, equal chest rise Abdomen: Soft, NTND, no rebound or guarding Incisions: c/d/i Ext: NT, No erythema  Laboratory values:  Recent Labs    02/04/18 1507 02/05/18 0542  HGB 11.2* 11.3*  HCT 35.5* 35.4*   Recent Labs    02/05/18 0542  CREATININE 0.93    Disposition: Home  Discharge instruction: The patient was instructed to be ambulatory but told to refrain from heavy lifting, strenuous activity, or driving.  Discharge medications:  Allergies as of 02/05/2018      Reactions   Amoxicillin Hives   Has patient had a PCN reaction causing immediate rash, facial/tongue/throat swelling, SOB or lightheadedness with hypotension: No Has patient had a PCN reaction causing severe rash involving mucus membranes or skin necrosis: Yes Has patient had a PCN reaction that required hospitalization: No Has patient had a PCN reaction occurring within the last 10 years: Yes If all of the above answers are "NO", then may proceed with Cephalosporin use.   Avocado Nausea And Vomiting   Bactrim [sulfamethoxazole-trimethoprim]    Headaches, fever, and chills    Ciprofloxacin Hives      Medication List    TAKE these medications   docusate sodium 100 MG capsule Commonly known as:  COLACE Take 1 capsule (100 mg  total) by mouth 2 (two) times daily.   HYDROcodone-acetaminophen 5-325 MG tablet Commonly known as:  NORCO Take 1-2 tablets by mouth every 4 (four) hours as needed for moderate pain or severe pain. What changed:    how much to take  reasons to take this   ondansetron 4 MG tablet Commonly known as:  ZOFRAN Take 1 tablet (4 mg total) by mouth every 8 (eight) hours as needed for nausea or vomiting. What changed:  when to take this   phenazopyridine 200 MG tablet Commonly known as:  PYRIDIUM Take 1 tablet (200 mg total) by mouth 3 (three) times daily as needed for pain.   ranitidine 150 MG tablet Commonly known as:  ZANTAC Take 150 mg by mouth daily as needed for heartburn.       Followup:  Follow-up Information    Ceasar Mons, MD Follow up on 02/19/2018.   Specialty:  Urology Why:  at 8:45 Contact information: [address] [place] [address] [phone]

## 2018-02-05 NOTE — Progress Notes (Signed)
Pt d/c to home. Discharge instructions, reasons to return to ED/MD, prescriptions, and after visit care reviewed witrh pt. Pt confirmed understanding with teachback method. Pt awaiting wife's arrival to be discharged. Discharge paperwork signed by RN and pt.

## 2018-02-13 DIAGNOSIS — R8271 Bacteriuria: Secondary | ICD-10-CM | POA: Diagnosis not present

## 2018-02-13 DIAGNOSIS — B390 Acute pulmonary histoplasmosis capsulati: Secondary | ICD-10-CM | POA: Diagnosis not present

## 2018-02-13 DIAGNOSIS — C641 Malignant neoplasm of right kidney, except renal pelvis: Secondary | ICD-10-CM | POA: Diagnosis not present

## 2018-02-16 ENCOUNTER — Encounter (HOSPITAL_COMMUNITY): Payer: Self-pay | Admitting: Urology

## 2018-02-19 DIAGNOSIS — C641 Malignant neoplasm of right kidney, except renal pelvis: Secondary | ICD-10-CM | POA: Diagnosis not present

## 2018-03-17 DIAGNOSIS — C641 Malignant neoplasm of right kidney, except renal pelvis: Secondary | ICD-10-CM | POA: Diagnosis not present

## 2018-04-21 DIAGNOSIS — C641 Malignant neoplasm of right kidney, except renal pelvis: Secondary | ICD-10-CM | POA: Diagnosis not present

## 2018-04-21 DIAGNOSIS — R1084 Generalized abdominal pain: Secondary | ICD-10-CM | POA: Diagnosis not present

## 2018-04-27 DIAGNOSIS — K080 Exfoliation of teeth due to systemic causes: Secondary | ICD-10-CM | POA: Diagnosis not present

## 2018-05-18 ENCOUNTER — Ambulatory Visit (HOSPITAL_COMMUNITY)
Admission: RE | Admit: 2018-05-18 | Discharge: 2018-05-18 | Disposition: A | Discharge status: Home / Self Care | Payer: Federal, State, Local not specified - PPO | Source: Ambulatory Visit | Attending: Urology | Admitting: Urology

## 2018-05-18 ENCOUNTER — Other Ambulatory Visit (HOSPITAL_COMMUNITY): Payer: Self-pay | Admitting: Urology

## 2018-05-18 DIAGNOSIS — C641 Malignant neoplasm of right kidney, except renal pelvis: Secondary | ICD-10-CM | POA: Diagnosis not present

## 2018-05-18 DIAGNOSIS — C649 Malignant neoplasm of unspecified kidney, except renal pelvis: Secondary | ICD-10-CM | POA: Diagnosis not present

## 2018-06-19 ENCOUNTER — Other Ambulatory Visit: Payer: Self-pay | Admitting: Family Medicine

## 2018-06-19 DIAGNOSIS — Z87891 Personal history of nicotine dependence: Secondary | ICD-10-CM | POA: Diagnosis not present

## 2018-06-19 DIAGNOSIS — Z85528 Personal history of other malignant neoplasm of kidney: Secondary | ICD-10-CM | POA: Diagnosis not present

## 2018-06-19 DIAGNOSIS — R590 Localized enlarged lymph nodes: Secondary | ICD-10-CM | POA: Diagnosis not present

## 2018-06-19 DIAGNOSIS — M542 Cervicalgia: Secondary | ICD-10-CM | POA: Diagnosis not present

## 2018-06-19 DIAGNOSIS — R599 Enlarged lymph nodes, unspecified: Secondary | ICD-10-CM

## 2018-06-23 ENCOUNTER — Telehealth: Payer: Self-pay | Admitting: Nurse Practitioner

## 2018-06-23 NOTE — Telephone Encounter (Signed)
Phone call to patient to verify contrast allergy and discuss instructions for 13 hr prep for CT with contrast on 06/25/18. Pt verbalized understanding.  0250 - 50mg  Prednisone 0850 - 50mg  Prednisone 1450 - 50mg  Prednisone and 50mg  Benadryl

## 2018-06-25 ENCOUNTER — Ambulatory Visit
Admission: RE | Admit: 2018-06-25 | Discharge: 2018-06-25 | Disposition: A | Discharge status: Home / Self Care | Payer: Federal, State, Local not specified - PPO | Source: Ambulatory Visit | Attending: Family Medicine | Admitting: Family Medicine

## 2018-06-25 DIAGNOSIS — Z85818 Personal history of malignant neoplasm of other sites of lip, oral cavity, and pharynx: Secondary | ICD-10-CM | POA: Diagnosis not present

## 2018-06-25 DIAGNOSIS — R599 Enlarged lymph nodes, unspecified: Secondary | ICD-10-CM

## 2018-06-25 MED ORDER — IOPAMIDOL (ISOVUE-300) INJECTION 61%
75.0000 mL | Freq: Once | INTRAVENOUS | Status: AC | PRN
  Administered 2018-06-25: 75 mL via INTRAVENOUS

## 2018-06-29 DIAGNOSIS — K210 Gastro-esophageal reflux disease with esophagitis: Secondary | ICD-10-CM | POA: Diagnosis not present

## 2018-06-29 DIAGNOSIS — J040 Acute laryngitis: Secondary | ICD-10-CM | POA: Diagnosis not present

## 2018-07-09 DIAGNOSIS — J370 Chronic laryngitis: Secondary | ICD-10-CM | POA: Diagnosis not present

## 2018-07-09 DIAGNOSIS — J320 Chronic maxillary sinusitis: Secondary | ICD-10-CM | POA: Diagnosis not present

## 2018-07-23 DIAGNOSIS — J322 Chronic ethmoidal sinusitis: Secondary | ICD-10-CM | POA: Diagnosis not present

## 2018-07-23 DIAGNOSIS — J320 Chronic maxillary sinusitis: Secondary | ICD-10-CM | POA: Diagnosis not present

## 2018-07-23 DIAGNOSIS — J040 Acute laryngitis: Secondary | ICD-10-CM | POA: Diagnosis not present

## 2018-10-22 ENCOUNTER — Ambulatory Visit (HOSPITAL_COMMUNITY)
Admission: RE | Admit: 2018-10-22 | Discharge: 2018-10-22 | Disposition: A | Discharge status: Home / Self Care | Payer: Federal, State, Local not specified - PPO | Source: Ambulatory Visit | Attending: Urology | Admitting: Urology

## 2018-10-22 ENCOUNTER — Other Ambulatory Visit (HOSPITAL_COMMUNITY): Payer: Self-pay | Admitting: Urology

## 2018-10-22 DIAGNOSIS — C641 Malignant neoplasm of right kidney, except renal pelvis: Secondary | ICD-10-CM

## 2018-10-22 DIAGNOSIS — C649 Malignant neoplasm of unspecified kidney, except renal pelvis: Secondary | ICD-10-CM | POA: Diagnosis not present

## 2018-10-26 DIAGNOSIS — M65331 Trigger finger, right middle finger: Secondary | ICD-10-CM | POA: Diagnosis not present

## 2018-10-28 DIAGNOSIS — K080 Exfoliation of teeth due to systemic causes: Secondary | ICD-10-CM | POA: Diagnosis not present

## 2018-11-03 DIAGNOSIS — C641 Malignant neoplasm of right kidney, except renal pelvis: Secondary | ICD-10-CM | POA: Diagnosis not present

## 2018-11-06 DIAGNOSIS — E7800 Pure hypercholesterolemia, unspecified: Secondary | ICD-10-CM | POA: Diagnosis not present

## 2018-11-06 DIAGNOSIS — Z0000 Encounter for general adult medical examination without abnormal findings: Secondary | ICD-10-CM | POA: Diagnosis not present

## 2018-11-24 DIAGNOSIS — M65332 Trigger finger, left middle finger: Secondary | ICD-10-CM | POA: Diagnosis not present

## 2019-03-08 DIAGNOSIS — M65321 Trigger finger, right index finger: Secondary | ICD-10-CM | POA: Diagnosis not present

## 2019-03-08 DIAGNOSIS — M1812 Unilateral primary osteoarthritis of first carpometacarpal joint, left hand: Secondary | ICD-10-CM | POA: Diagnosis not present

## 2019-03-08 DIAGNOSIS — M65331 Trigger finger, right middle finger: Secondary | ICD-10-CM | POA: Diagnosis not present

## 2019-03-10 DIAGNOSIS — M65332 Trigger finger, left middle finger: Secondary | ICD-10-CM | POA: Diagnosis not present

## 2019-03-10 DIAGNOSIS — M65351 Trigger finger, right little finger: Secondary | ICD-10-CM | POA: Diagnosis not present

## 2019-03-10 DIAGNOSIS — M65312 Trigger thumb, left thumb: Secondary | ICD-10-CM | POA: Diagnosis not present

## 2019-03-10 DIAGNOSIS — M25541 Pain in joints of right hand: Secondary | ICD-10-CM | POA: Diagnosis not present

## 2019-03-16 DIAGNOSIS — E7800 Pure hypercholesterolemia, unspecified: Secondary | ICD-10-CM | POA: Diagnosis not present

## 2019-04-22 IMAGING — CR DG CHEST 2V
2 series · 2 of 2 positions shown · non-contrast
Comparison: None.

CLINICAL DATA: Renal cell carcinoma.

EXAM:
CHEST - 2 VIEW

[w chest pa]
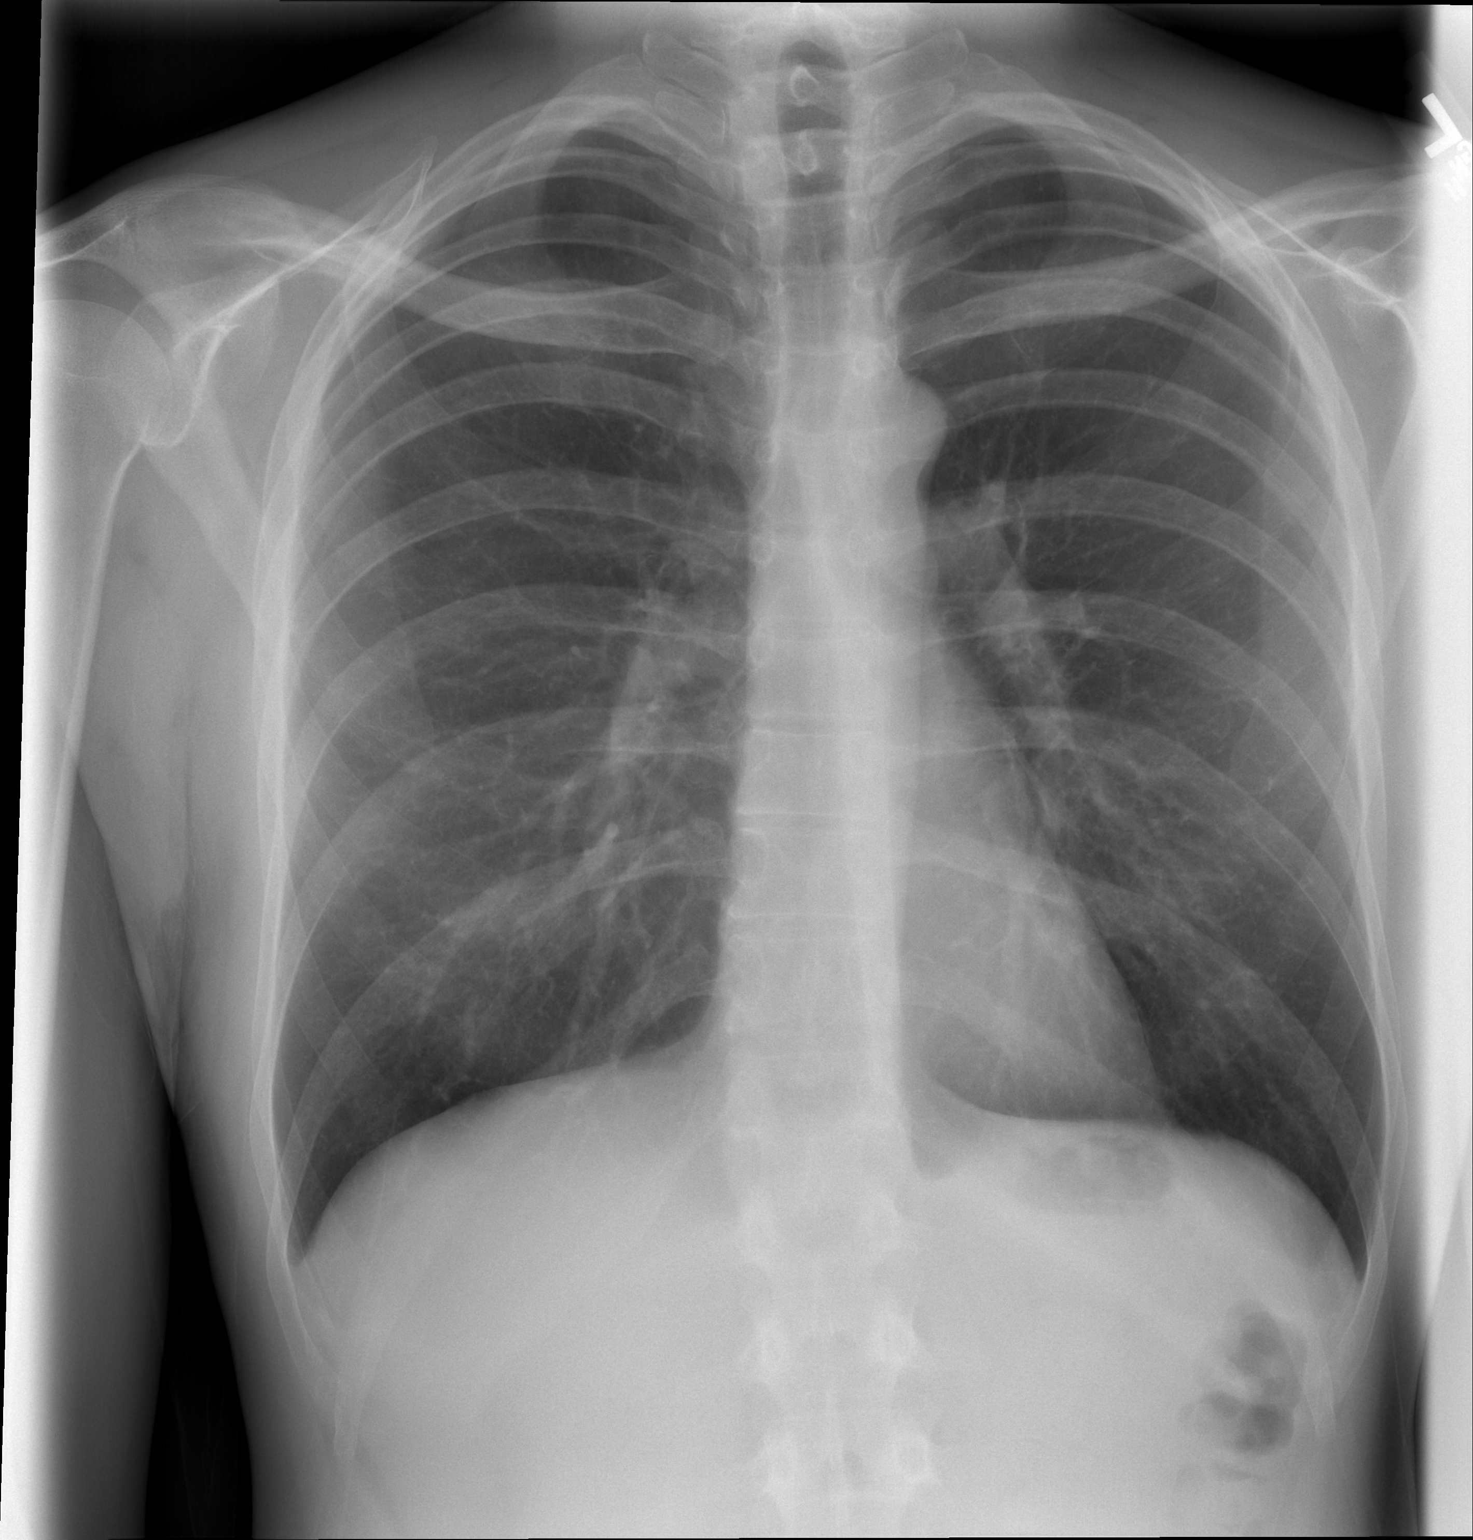

[w chest lat]
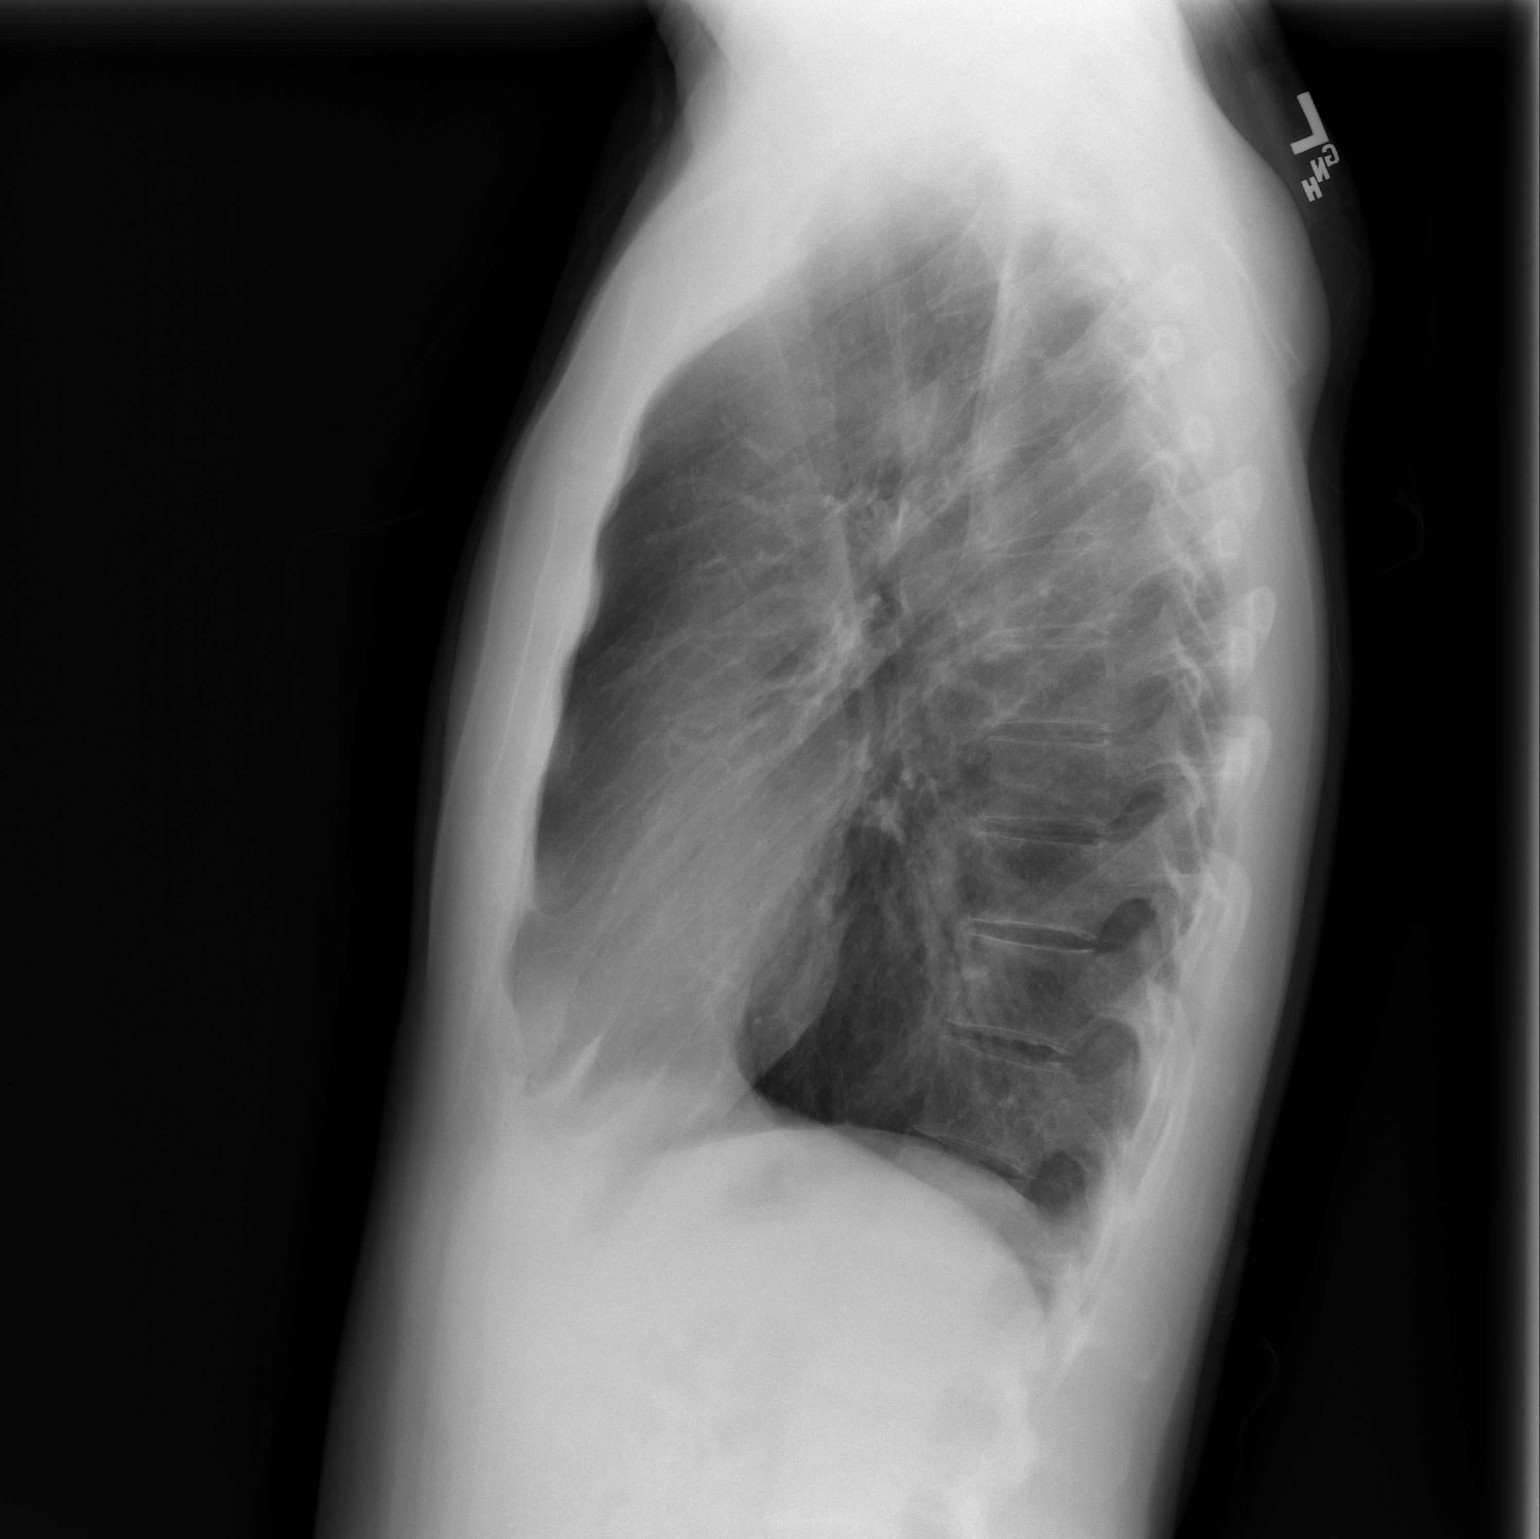

[2 of 2 positions shown; findings below may reference images not displayed]

FINDINGS: Heart and mediastinal contours are within normal limits. No focal
opacities or effusions. No acute bony abnormality.
IMPRESSION: No active cardiopulmonary disease.

## 2019-04-23 DIAGNOSIS — E7800 Pure hypercholesterolemia, unspecified: Secondary | ICD-10-CM | POA: Diagnosis not present

## 2019-05-03 ENCOUNTER — Other Ambulatory Visit (HOSPITAL_COMMUNITY): Payer: Self-pay | Admitting: Urology

## 2019-05-03 ENCOUNTER — Other Ambulatory Visit: Payer: Self-pay

## 2019-05-03 ENCOUNTER — Ambulatory Visit (HOSPITAL_COMMUNITY)
Admission: RE | Admit: 2019-05-03 | Discharge: 2019-05-03 | Disposition: A | Discharge status: Home / Self Care | Payer: Federal, State, Local not specified - PPO | Source: Ambulatory Visit | Attending: Urology | Admitting: Urology

## 2019-05-03 DIAGNOSIS — C641 Malignant neoplasm of right kidney, except renal pelvis: Secondary | ICD-10-CM

## 2019-05-03 DIAGNOSIS — C649 Malignant neoplasm of unspecified kidney, except renal pelvis: Secondary | ICD-10-CM | POA: Diagnosis not present

## 2019-05-03 DIAGNOSIS — M545 Low back pain: Secondary | ICD-10-CM | POA: Diagnosis not present

## 2019-06-02 DIAGNOSIS — S92504A Nondisplaced unspecified fracture of right lesser toe(s), initial encounter for closed fracture: Secondary | ICD-10-CM | POA: Diagnosis not present

## 2019-07-19 DIAGNOSIS — Z20828 Contact with and (suspected) exposure to other viral communicable diseases: Secondary | ICD-10-CM | POA: Diagnosis not present

## 2019-10-21 ENCOUNTER — Other Ambulatory Visit (HOSPITAL_COMMUNITY): Payer: Self-pay | Admitting: Urology

## 2019-10-21 ENCOUNTER — Other Ambulatory Visit: Payer: Self-pay

## 2019-10-21 ENCOUNTER — Ambulatory Visit (HOSPITAL_COMMUNITY)
Admission: RE | Admit: 2019-10-21 | Discharge: 2019-10-21 | Disposition: A | Discharge status: Home / Self Care | Payer: Federal, State, Local not specified - PPO | Source: Ambulatory Visit | Attending: Urology | Admitting: Urology

## 2019-10-21 DIAGNOSIS — C641 Malignant neoplasm of right kidney, except renal pelvis: Secondary | ICD-10-CM

## 2019-10-21 DIAGNOSIS — Z85528 Personal history of other malignant neoplasm of kidney: Secondary | ICD-10-CM | POA: Diagnosis not present

## 2019-11-25 DIAGNOSIS — M25641 Stiffness of right hand, not elsewhere classified: Secondary | ICD-10-CM | POA: Diagnosis not present

## 2019-11-25 DIAGNOSIS — M79675 Pain in left toe(s): Secondary | ICD-10-CM | POA: Diagnosis not present

## 2019-11-25 DIAGNOSIS — Z0000 Encounter for general adult medical examination without abnormal findings: Secondary | ICD-10-CM | POA: Diagnosis not present

## 2019-11-25 DIAGNOSIS — M25642 Stiffness of left hand, not elsewhere classified: Secondary | ICD-10-CM | POA: Diagnosis not present

## 2019-11-25 DIAGNOSIS — Z125 Encounter for screening for malignant neoplasm of prostate: Secondary | ICD-10-CM | POA: Diagnosis not present

## 2019-11-25 DIAGNOSIS — E7800 Pure hypercholesterolemia, unspecified: Secondary | ICD-10-CM | POA: Diagnosis not present

## 2019-12-22 DIAGNOSIS — M79642 Pain in left hand: Secondary | ICD-10-CM | POA: Diagnosis not present

## 2019-12-22 DIAGNOSIS — M79641 Pain in right hand: Secondary | ICD-10-CM | POA: Diagnosis not present

## 2019-12-22 DIAGNOSIS — M2550 Pain in unspecified joint: Secondary | ICD-10-CM | POA: Diagnosis not present

## 2020-01-13 DIAGNOSIS — M79675 Pain in left toe(s): Secondary | ICD-10-CM | POA: Diagnosis not present

## 2020-01-13 DIAGNOSIS — M65351 Trigger finger, right little finger: Secondary | ICD-10-CM | POA: Diagnosis not present

## 2020-01-13 DIAGNOSIS — G5761 Lesion of plantar nerve, right lower limb: Secondary | ICD-10-CM | POA: Diagnosis not present

## 2020-01-24 DIAGNOSIS — D225 Melanocytic nevi of trunk: Secondary | ICD-10-CM | POA: Diagnosis not present

## 2020-01-24 DIAGNOSIS — L821 Other seborrheic keratosis: Secondary | ICD-10-CM | POA: Diagnosis not present

## 2020-01-24 DIAGNOSIS — D1801 Hemangioma of skin and subcutaneous tissue: Secondary | ICD-10-CM | POA: Diagnosis not present

## 2020-03-19 DIAGNOSIS — T63441A Toxic effect of venom of bees, accidental (unintentional), initial encounter: Secondary | ICD-10-CM | POA: Diagnosis not present

## 2020-05-09 DIAGNOSIS — Z1159 Encounter for screening for other viral diseases: Secondary | ICD-10-CM | POA: Diagnosis not present

## 2020-05-12 DIAGNOSIS — K6289 Other specified diseases of anus and rectum: Secondary | ICD-10-CM | POA: Diagnosis not present

## 2020-05-12 DIAGNOSIS — Z1211 Encounter for screening for malignant neoplasm of colon: Secondary | ICD-10-CM | POA: Diagnosis not present

## 2020-05-12 DIAGNOSIS — K635 Polyp of colon: Secondary | ICD-10-CM | POA: Diagnosis not present

## 2020-05-12 DIAGNOSIS — Z800 Family history of malignant neoplasm of digestive organs: Secondary | ICD-10-CM | POA: Diagnosis not present

## 2020-06-02 DIAGNOSIS — H8112 Benign paroxysmal vertigo, left ear: Secondary | ICD-10-CM | POA: Diagnosis not present

## 2020-06-08 DIAGNOSIS — Z20822 Contact with and (suspected) exposure to covid-19: Secondary | ICD-10-CM | POA: Diagnosis not present

## 2020-06-16 DIAGNOSIS — Z20822 Contact with and (suspected) exposure to covid-19: Secondary | ICD-10-CM | POA: Diagnosis not present

## 2020-07-04 DIAGNOSIS — M65351 Trigger finger, right little finger: Secondary | ICD-10-CM | POA: Diagnosis not present

## 2020-07-04 DIAGNOSIS — M1812 Unilateral primary osteoarthritis of first carpometacarpal joint, left hand: Secondary | ICD-10-CM | POA: Diagnosis not present

## 2020-08-08 DIAGNOSIS — M65351 Trigger finger, right little finger: Secondary | ICD-10-CM | POA: Diagnosis not present

## 2020-09-24 IMAGING — DX DG CHEST 2V
2 series · 2 of 2 positions shown · non-contrast
Comparison: 05/03/2019

CLINICAL DATA: Right-sided renal cell carcinoma, previous tobacco
abuse

EXAM:
CHEST - 2 VIEW

[chest pa]
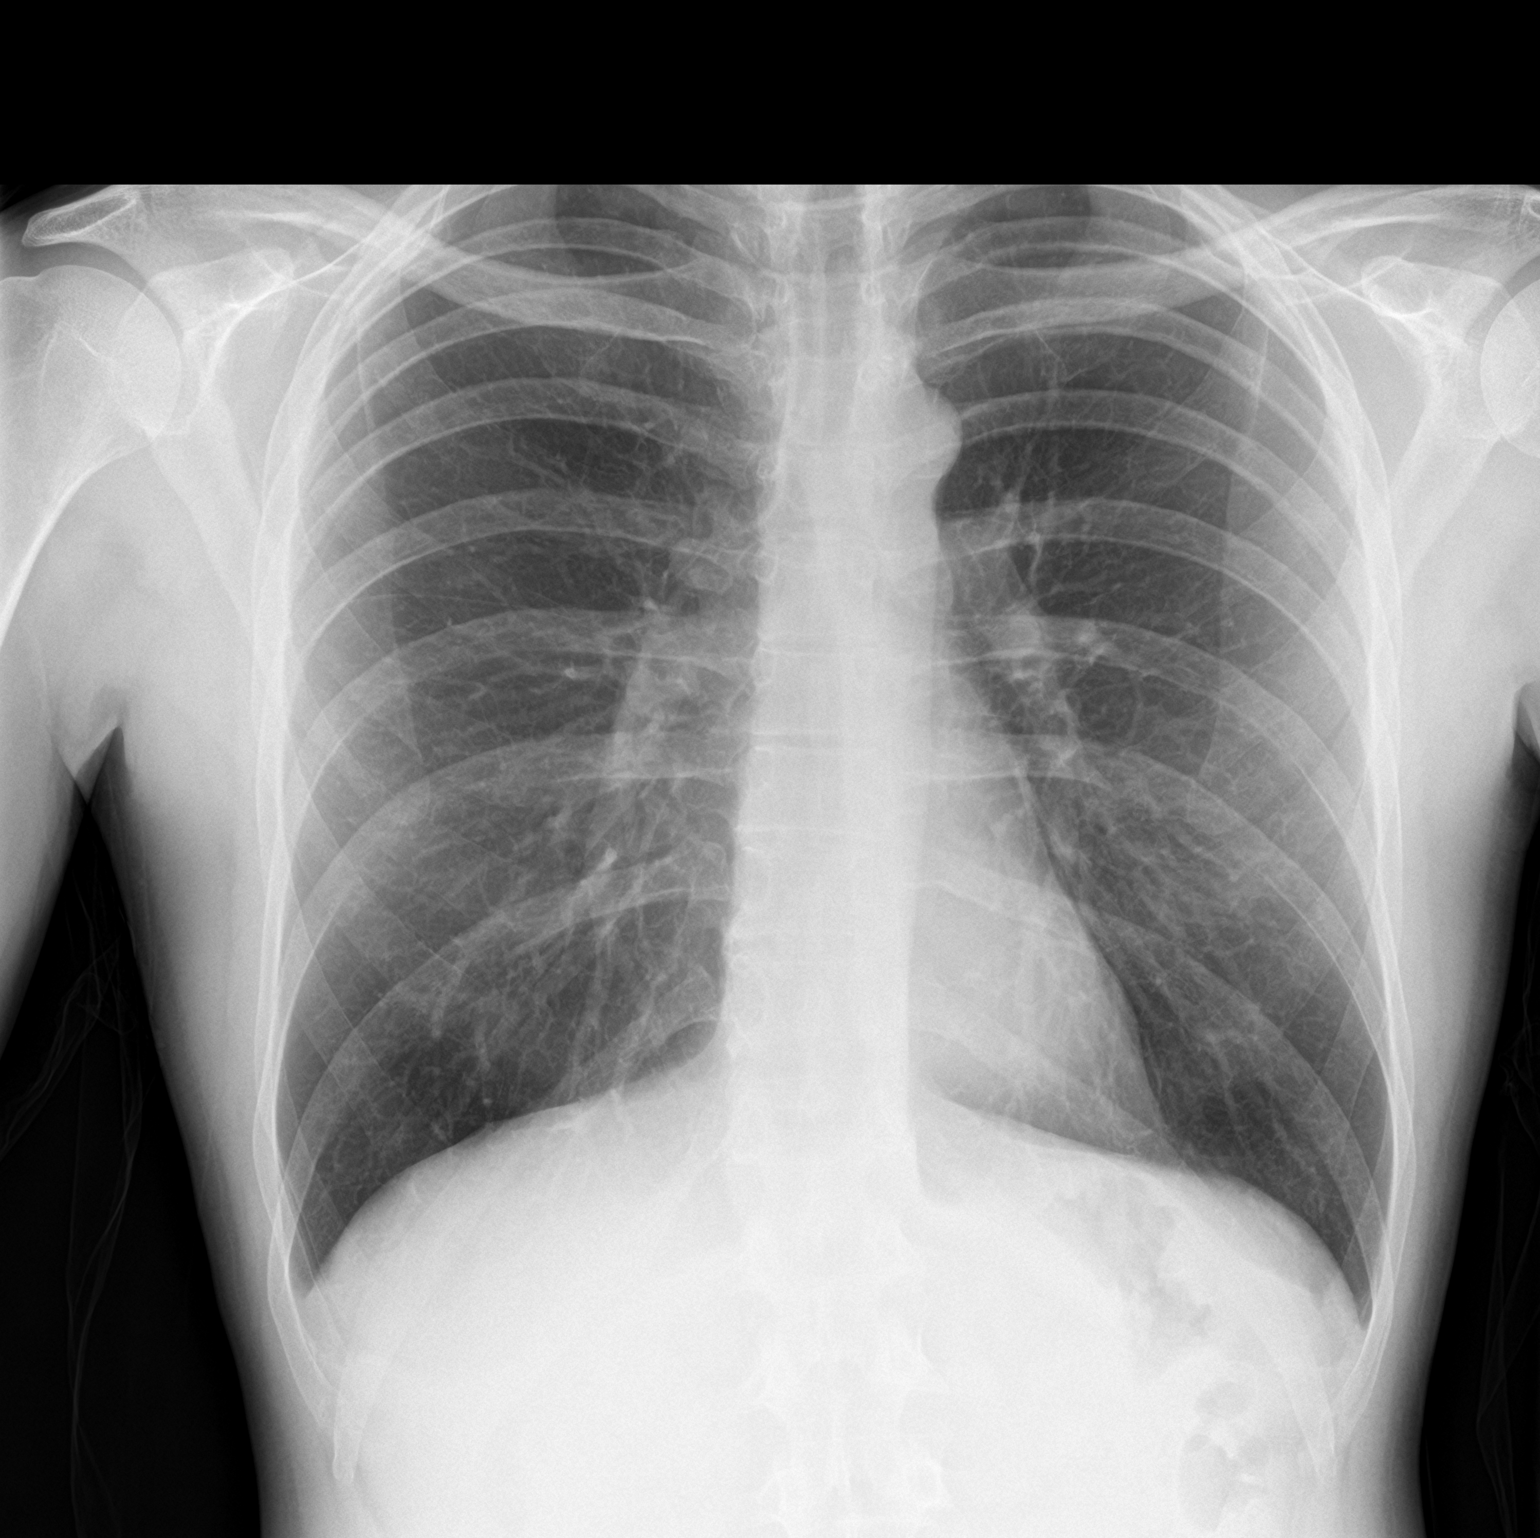

[chest lat]
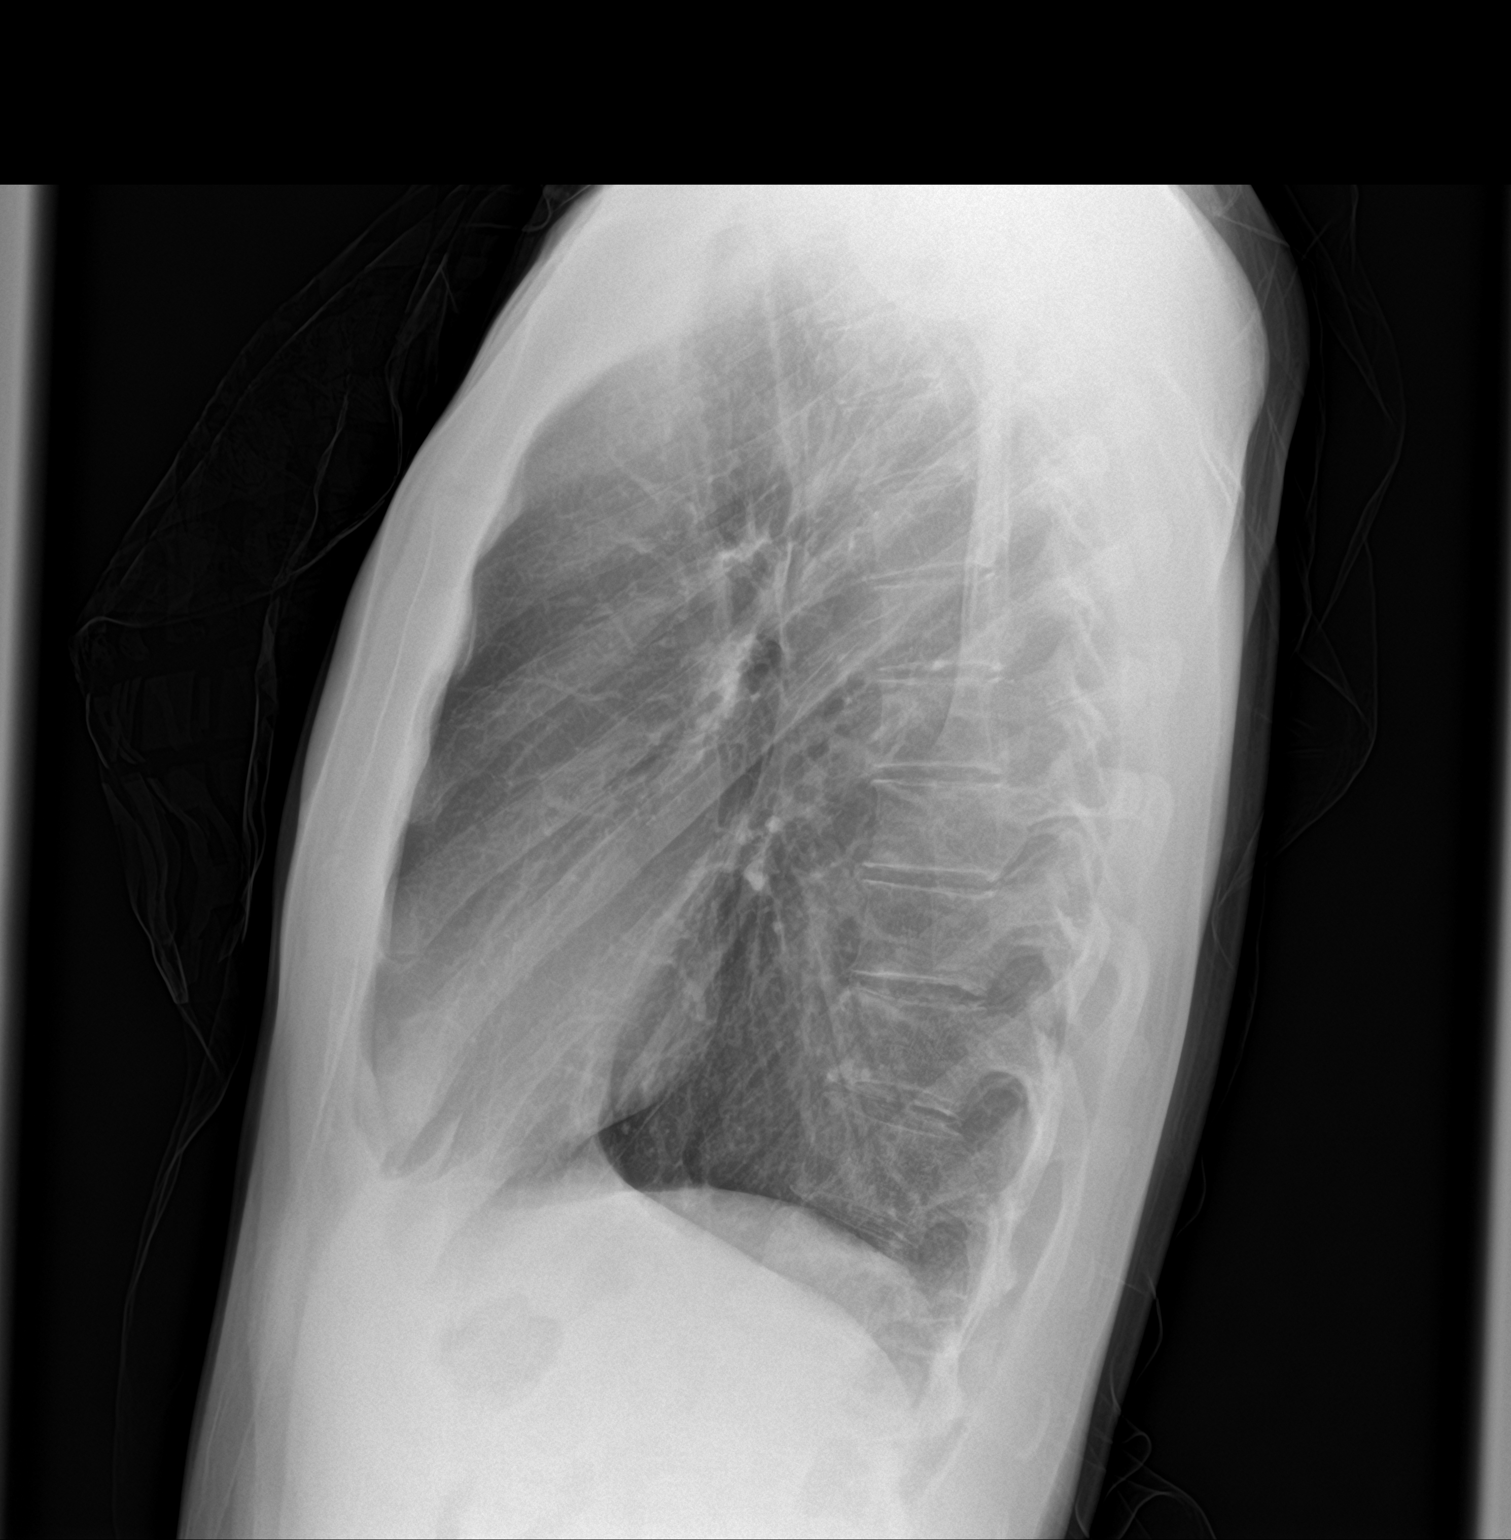

[2 of 2 positions shown; findings below may reference images not displayed]

FINDINGS: Frontal and lateral views of the chest demonstrate an unremarkable
cardiac silhouette. No airspace disease, effusion, or pneumothorax.
No pulmonary nodules. No acute bony abnormalities.
IMPRESSION: No active cardiopulmonary disease.

## 2020-11-15 ENCOUNTER — Ambulatory Visit (HOSPITAL_COMMUNITY)
Admission: RE | Admit: 2020-11-15 | Discharge: 2020-11-15 | Disposition: A | Discharge status: Home / Self Care | Payer: Federal, State, Local not specified - PPO | Source: Ambulatory Visit | Attending: Urology | Admitting: Urology

## 2020-11-15 ENCOUNTER — Other Ambulatory Visit (HOSPITAL_COMMUNITY): Payer: Self-pay | Admitting: Urology

## 2020-11-15 ENCOUNTER — Other Ambulatory Visit: Payer: Self-pay

## 2020-11-15 DIAGNOSIS — C641 Malignant neoplasm of right kidney, except renal pelvis: Secondary | ICD-10-CM

## 2020-11-15 DIAGNOSIS — Z125 Encounter for screening for malignant neoplasm of prostate: Secondary | ICD-10-CM | POA: Diagnosis not present

## 2020-11-15 DIAGNOSIS — K219 Gastro-esophageal reflux disease without esophagitis: Secondary | ICD-10-CM | POA: Diagnosis not present

## 2020-11-16 DIAGNOSIS — K439 Ventral hernia without obstruction or gangrene: Secondary | ICD-10-CM | POA: Diagnosis not present

## 2020-11-16 DIAGNOSIS — C641 Malignant neoplasm of right kidney, except renal pelvis: Secondary | ICD-10-CM | POA: Diagnosis not present

## 2020-11-16 DIAGNOSIS — Z905 Acquired absence of kidney: Secondary | ICD-10-CM | POA: Diagnosis not present

## 2020-11-16 DIAGNOSIS — K429 Umbilical hernia without obstruction or gangrene: Secondary | ICD-10-CM | POA: Diagnosis not present

## 2020-12-08 DIAGNOSIS — E7800 Pure hypercholesterolemia, unspecified: Secondary | ICD-10-CM | POA: Diagnosis not present

## 2020-12-08 DIAGNOSIS — Z125 Encounter for screening for malignant neoplasm of prostate: Secondary | ICD-10-CM | POA: Diagnosis not present

## 2020-12-08 DIAGNOSIS — Z23 Encounter for immunization: Secondary | ICD-10-CM | POA: Diagnosis not present

## 2020-12-08 DIAGNOSIS — Z8616 Personal history of COVID-19: Secondary | ICD-10-CM | POA: Diagnosis not present

## 2020-12-08 DIAGNOSIS — Z0000 Encounter for general adult medical examination without abnormal findings: Secondary | ICD-10-CM | POA: Diagnosis not present

## 2020-12-08 DIAGNOSIS — Z85528 Personal history of other malignant neoplasm of kidney: Secondary | ICD-10-CM | POA: Diagnosis not present

## 2021-03-20 DIAGNOSIS — Z23 Encounter for immunization: Secondary | ICD-10-CM | POA: Diagnosis not present

## 2021-05-28 DIAGNOSIS — L820 Inflamed seborrheic keratosis: Secondary | ICD-10-CM | POA: Diagnosis not present

## 2021-05-28 DIAGNOSIS — D485 Neoplasm of uncertain behavior of skin: Secondary | ICD-10-CM | POA: Diagnosis not present

## 2021-05-31 DIAGNOSIS — M65351 Trigger finger, right little finger: Secondary | ICD-10-CM | POA: Diagnosis not present

## 2021-05-31 DIAGNOSIS — M25649 Stiffness of unspecified hand, not elsewhere classified: Secondary | ICD-10-CM | POA: Diagnosis not present

## 2021-07-03 DIAGNOSIS — M25649 Stiffness of unspecified hand, not elsewhere classified: Secondary | ICD-10-CM | POA: Diagnosis not present

## 2021-07-03 DIAGNOSIS — M65351 Trigger finger, right little finger: Secondary | ICD-10-CM | POA: Diagnosis not present

## 2021-08-28 DIAGNOSIS — R053 Chronic cough: Secondary | ICD-10-CM | POA: Diagnosis not present

## 2021-11-26 ENCOUNTER — Ambulatory Visit (HOSPITAL_COMMUNITY)
Admission: RE | Admit: 2021-11-26 | Discharge: 2021-11-26 | Disposition: A | Discharge status: Home / Self Care | Payer: Federal, State, Local not specified - PPO | Source: Ambulatory Visit | Attending: Urology | Admitting: Urology

## 2021-11-26 ENCOUNTER — Other Ambulatory Visit: Payer: Self-pay

## 2021-11-26 ENCOUNTER — Other Ambulatory Visit (HOSPITAL_COMMUNITY): Payer: Self-pay | Admitting: Urology

## 2021-11-26 DIAGNOSIS — Z125 Encounter for screening for malignant neoplasm of prostate: Secondary | ICD-10-CM | POA: Diagnosis not present

## 2021-11-26 DIAGNOSIS — Q632 Ectopic kidney: Secondary | ICD-10-CM | POA: Diagnosis not present

## 2021-11-26 DIAGNOSIS — C641 Malignant neoplasm of right kidney, except renal pelvis: Secondary | ICD-10-CM | POA: Diagnosis not present

## 2021-11-26 DIAGNOSIS — Z85528 Personal history of other malignant neoplasm of kidney: Secondary | ICD-10-CM | POA: Diagnosis not present

## 2021-11-26 DIAGNOSIS — Z905 Acquired absence of kidney: Secondary | ICD-10-CM | POA: Diagnosis not present

## 2022-01-07 DIAGNOSIS — M25512 Pain in left shoulder: Secondary | ICD-10-CM | POA: Diagnosis not present

## 2022-01-11 DIAGNOSIS — K219 Gastro-esophageal reflux disease without esophagitis: Secondary | ICD-10-CM | POA: Diagnosis not present

## 2022-01-11 DIAGNOSIS — M25512 Pain in left shoulder: Secondary | ICD-10-CM | POA: Diagnosis not present

## 2022-01-11 DIAGNOSIS — Z0000 Encounter for general adult medical examination without abnormal findings: Secondary | ICD-10-CM | POA: Diagnosis not present

## 2022-01-11 DIAGNOSIS — Z23 Encounter for immunization: Secondary | ICD-10-CM | POA: Diagnosis not present

## 2022-01-11 DIAGNOSIS — E7800 Pure hypercholesterolemia, unspecified: Secondary | ICD-10-CM | POA: Diagnosis not present

## 2022-01-11 DIAGNOSIS — R030 Elevated blood-pressure reading, without diagnosis of hypertension: Secondary | ICD-10-CM | POA: Diagnosis not present

## 2022-01-15 DIAGNOSIS — M7542 Impingement syndrome of left shoulder: Secondary | ICD-10-CM | POA: Diagnosis not present

## 2022-01-16 DIAGNOSIS — M7542 Impingement syndrome of left shoulder: Secondary | ICD-10-CM | POA: Diagnosis not present

## 2022-01-23 DIAGNOSIS — M7542 Impingement syndrome of left shoulder: Secondary | ICD-10-CM | POA: Diagnosis not present

## 2022-01-25 DIAGNOSIS — M7542 Impingement syndrome of left shoulder: Secondary | ICD-10-CM | POA: Diagnosis not present

## 2022-01-29 DIAGNOSIS — M7542 Impingement syndrome of left shoulder: Secondary | ICD-10-CM | POA: Diagnosis not present

## 2022-01-31 DIAGNOSIS — M7542 Impingement syndrome of left shoulder: Secondary | ICD-10-CM | POA: Diagnosis not present

## 2022-02-05 DIAGNOSIS — M7542 Impingement syndrome of left shoulder: Secondary | ICD-10-CM | POA: Diagnosis not present

## 2022-02-12 DIAGNOSIS — M7542 Impingement syndrome of left shoulder: Secondary | ICD-10-CM | POA: Diagnosis not present

## 2022-02-14 DIAGNOSIS — M7542 Impingement syndrome of left shoulder: Secondary | ICD-10-CM | POA: Diagnosis not present

## 2022-02-18 DIAGNOSIS — M67912 Unspecified disorder of synovium and tendon, left shoulder: Secondary | ICD-10-CM | POA: Diagnosis not present

## 2022-05-06 DIAGNOSIS — D485 Neoplasm of uncertain behavior of skin: Secondary | ICD-10-CM | POA: Diagnosis not present

## 2022-05-06 DIAGNOSIS — L281 Prurigo nodularis: Secondary | ICD-10-CM | POA: Diagnosis not present

## 2022-05-20 DIAGNOSIS — S62655A Nondisplaced fracture of medial phalanx of left ring finger, initial encounter for closed fracture: Secondary | ICD-10-CM | POA: Diagnosis not present

## 2022-05-20 DIAGNOSIS — W2103XA Struck by baseball, initial encounter: Secondary | ICD-10-CM | POA: Diagnosis not present

## 2022-08-16 DIAGNOSIS — H00014 Hordeolum externum left upper eyelid: Secondary | ICD-10-CM | POA: Diagnosis not present

## 2022-11-25 ENCOUNTER — Ambulatory Visit (HOSPITAL_COMMUNITY)
Admission: RE | Admit: 2022-11-25 | Discharge: 2022-11-25 | Disposition: A | Discharge status: Home / Self Care | Payer: Federal, State, Local not specified - PPO | Source: Ambulatory Visit | Attending: Urology | Admitting: Urology

## 2022-11-25 ENCOUNTER — Other Ambulatory Visit (HOSPITAL_COMMUNITY): Payer: Self-pay | Admitting: Urology

## 2022-11-25 DIAGNOSIS — R918 Other nonspecific abnormal finding of lung field: Secondary | ICD-10-CM | POA: Diagnosis not present

## 2022-11-25 DIAGNOSIS — Z125 Encounter for screening for malignant neoplasm of prostate: Secondary | ICD-10-CM | POA: Diagnosis not present

## 2022-11-25 DIAGNOSIS — K3189 Other diseases of stomach and duodenum: Secondary | ICD-10-CM | POA: Diagnosis not present

## 2022-11-25 DIAGNOSIS — C641 Malignant neoplasm of right kidney, except renal pelvis: Secondary | ICD-10-CM | POA: Diagnosis not present

## 2022-12-02 DIAGNOSIS — C641 Malignant neoplasm of right kidney, except renal pelvis: Secondary | ICD-10-CM | POA: Diagnosis not present

## 2023-02-18 DIAGNOSIS — Z5181 Encounter for therapeutic drug level monitoring: Secondary | ICD-10-CM | POA: Diagnosis not present

## 2023-02-18 DIAGNOSIS — Z125 Encounter for screening for malignant neoplasm of prostate: Secondary | ICD-10-CM | POA: Diagnosis not present

## 2023-02-18 DIAGNOSIS — Z0000 Encounter for general adult medical examination without abnormal findings: Secondary | ICD-10-CM | POA: Diagnosis not present

## 2023-02-18 DIAGNOSIS — E7800 Pure hypercholesterolemia, unspecified: Secondary | ICD-10-CM | POA: Diagnosis not present

## 2023-09-18 DIAGNOSIS — M7712 Lateral epicondylitis, left elbow: Secondary | ICD-10-CM | POA: Diagnosis not present

## 2023-09-18 DIAGNOSIS — M67911 Unspecified disorder of synovium and tendon, right shoulder: Secondary | ICD-10-CM | POA: Diagnosis not present

## 2023-09-23 DIAGNOSIS — M7501 Adhesive capsulitis of right shoulder: Secondary | ICD-10-CM | POA: Diagnosis not present

## 2023-09-25 DIAGNOSIS — M7501 Adhesive capsulitis of right shoulder: Secondary | ICD-10-CM | POA: Diagnosis not present

## 2023-09-29 DIAGNOSIS — M7501 Adhesive capsulitis of right shoulder: Secondary | ICD-10-CM | POA: Diagnosis not present

## 2023-10-02 DIAGNOSIS — M7501 Adhesive capsulitis of right shoulder: Secondary | ICD-10-CM | POA: Diagnosis not present

## 2023-10-07 DIAGNOSIS — M7501 Adhesive capsulitis of right shoulder: Secondary | ICD-10-CM | POA: Diagnosis not present

## 2023-10-09 DIAGNOSIS — M67911 Unspecified disorder of synovium and tendon, right shoulder: Secondary | ICD-10-CM | POA: Diagnosis not present

## 2023-10-09 DIAGNOSIS — M7501 Adhesive capsulitis of right shoulder: Secondary | ICD-10-CM | POA: Diagnosis not present

## 2023-10-14 DIAGNOSIS — M7501 Adhesive capsulitis of right shoulder: Secondary | ICD-10-CM | POA: Diagnosis not present

## 2023-10-16 DIAGNOSIS — M7501 Adhesive capsulitis of right shoulder: Secondary | ICD-10-CM | POA: Diagnosis not present

## 2024-03-14 DIAGNOSIS — T63461A Toxic effect of venom of wasps, accidental (unintentional), initial encounter: Secondary | ICD-10-CM | POA: Diagnosis not present

## 2024-03-14 DIAGNOSIS — T63451A Toxic effect of venom of hornets, accidental (unintentional), initial encounter: Secondary | ICD-10-CM | POA: Diagnosis not present

## 2024-03-14 DIAGNOSIS — T63441A Toxic effect of venom of bees, accidental (unintentional), initial encounter: Secondary | ICD-10-CM | POA: Diagnosis not present

## 2024-03-29 NOTE — Progress Notes (Signed)
 New Patient Note  RE: Raymond Dominguez MRN: [id number] DOB: August 18, 1971 Date of Office Visit: 03/30/2024  Consult requested by: Seabron Lenis, MD Primary care provider: Seabron Lenis, MD  Chief Complaint: Establish Care (Patient reports that he was stung by a yellow jacket and by the evening the hand was red and swollen. Patient reports that the next day it was double the size. Patient reports having issues in the past with bees and spider bites. )  History of Present Illness: I had the pleasure of seeing Nivaan Dicenzo for initial evaluation at the [facility] on 03/30/2024. He is a 53 y.o. male, who is self-referred here for the evaluation of insect bite allergy.  Discussed the use of AI scribe software for clinical note transcription with the patient, who gave verbal consent to proceed.    He was stung by a yellow jacket on his left hand on July 5th while mowing the lawn. He applied hydrocortisone cream immediately and continued mowing. Four hours later, redness and swelling developed in his hand, worsening by the next morning with swelling extending into his knuckles. He visited urgent care on July 6th, where he received a steroid shot and antibiotics. The swelling increased by Sunday night but began to reduce by Monday morning, returning to near normal by Monday night.  He has a history of localized reactions to insect stings, including previous yellow jacket stings without severe reactions. He has been bitten by spiders, bees, and other insects, with reactions worsening over time but remaining localized to the skin. He has never experienced systemic symptoms such as respiratory distress or anaphylaxis.  He has seasonal environmental allergies, particularly in the spring, which have improved since college. He experiences a gastrointestinal reaction to avocado and is allergic to penicillin, which causes rashes or hives. He does not have a history of asthma or frequent  infections.  He had kidney cancer in 2019, treated with partial nephrectomy without chemotherapy or radiation. He experienced allergic reactions to antibiotics used during cancer procedures, including penicillin and Bactrim .  No smoking history. No recent fevers, chills, breathing difficulties, or frequent infections. He takes medication for cholesterol.      03/14/2024 UC visit: Mowing lawn yesterday, saw the bug fly away after the sting, unsure if wasp or bee or other. Sxs of erythema swelling and itching at dorsal L hand, now spreading to fingers and up forearm.   Denies fever, chills, erythema, ecchymosis, numbness, tingling, pointing lesion, discharge, pain, ttp, chest pain, SOB, cyanosis, cold limb, open wound, airway involvement, dyspnea, lips/mouth or OP tingling or edema, urticaria  Assessment and Plan: Chrisopher is a 53 y.o. male with: Local reaction to hymenoptera sting Localized reaction to yellow jacket sting with significant swelling requiring systemic steroids and antibiotics. History of worsening localized reactions, no systemic allergic reactions. Discussed future anaphylaxis risk - which is low. Continue to avoid.  Get bloodwork If significantly positive - consider venom immunotherapy. If negative - monitor symptoms. If you notice worsening reactions with subsequent stings then let us  know. For mild symptoms you can take over the counter antihistamines (zyrtec 10mg  to 20mg ) and monitor symptoms closely.  If symptoms worsen or if you have severe symptoms including breathing issues, throat closure, significant swelling, whole body hives, severe diarrhea and vomiting, lightheadedness then seek immediate medical care.  No indication for an epinephrine  device at this time given he only had localized reactions.   Return if symptoms worsen or fail to improve.  No  orders of the defined types were placed in this encounter.  Lab Orders         Allergen Fire Ant         Hymenoptera  Venom Allergy II      Other allergy screening: Asthma: no Rhino conjunctivitis: yes Symptomatic mainly in the spring and improved the past years.  Food allergy: no Avocado causes GI symptoms. Medication allergy: yes Urticaria: no Eczema:no History of recurrent infections suggestive of immunodeficency: no  Diagnostics: None.   Past Medical History: Patient Active Problem List   Diagnosis Date Noted   Renal mass 02/04/2018   Palpitations 04/05/2014   Atypical chest pain 04/05/2014   Past Medical History:  Diagnosis Date   Family history of Wolff-Parkinson-White (WPW) syndrome    brother had ablation for WPW at age 45   GERD (gastroesophageal reflux disease)    Hematuria    History of concussion    11-27-2017 per pt as teen w/ no residual   History of palpitations in adulthood normal echo 07/ 2015,  event monitor 2015  showed ST occaional   11-27-2017 per pt evaluated for palpitations in 2015 by dr berry , cardiologist (note in epic), was told situtional anxiety and stress, has not had any issues since   Renal mass, right    Urticaria    Wears glasses    Past Surgical History: Past Surgical History:  Procedure Laterality Date   CYSTOSCOPY/RETROGRADE/URETEROSCOPY Right 11/28/2017   Procedure: CYSTOSCOPY/RETROGRADE/URETEROSCOPY BIOPSY;  Surgeon: Devere Lonni Righter, MD;  Location: [facility];  Service: Urology;  Laterality: Right;   REMOVAL CYST LEFT HAND  2001   ROBOT ASSISTED LAPAROSCOPIC NEPHRECTOMY Right 02/04/2018   Procedure: XI ROBOTIC ASSISTED LAPAROSCOPIC PARTIAL NEPHRECTOMY/ POSSIBLE RADICAL NEPHRECTOMY;  Surgeon: Devere Lonni Righter, MD;  Location: [facility];  Service: Urology;  Laterality: Right;   TRANSTHORACIC ECHOCARDIOGRAM  04-14-2014  dr berry   ef 60-65%/ trivial AR and TR   WISDOM TOOTH EXTRACTION  1989   Medication List:  Current Outpatient Medications  Medication Sig Dispense Refill   rosuvastatin (CRESTOR) 5 MG tablet Take 5  mg by mouth daily.     docusate sodium  (COLACE) 100 MG capsule Take 1 capsule (100 mg total) by mouth 2 (two) times daily. (Patient not taking: Reported on 03/30/2024) 30 capsule 0   HYDROcodone -acetaminophen  (NORCO) 5-325 MG tablet Take 1-2 tablets by mouth every 4 (four) hours as needed for moderate pain or severe pain. (Patient not taking: Reported on 03/30/2024) 30 tablet 0   ranitidine (ZANTAC) 150 MG tablet Take 150 mg by mouth daily as needed for heartburn.  (Patient not taking: Reported on 03/30/2024)     No current facility-administered medications for this visit.   Allergies: Allergies  Allergen Reactions   Contrast Media [Iodinated Contrast Media] Hives   Amoxicillin Hives    Has patient had a PCN reaction causing immediate rash, facial/tongue/throat swelling, SOB or lightheadedness with hypotension: No Has patient had a PCN reaction causing severe rash involving mucus membranes or skin necrosis: Yes Has patient had a PCN reaction that required hospitalization: No Has patient had a PCN reaction occurring within the last 10 years: Yes If all of the above answers are NO, then may proceed with Cephalosporin use.    Avocado Nausea And Vomiting   Bactrim  [Sulfamethoxazole -Trimethoprim ]     Headaches, fever, and chills    Ciprofloxacin  Hives   Social History: Social History   Socioeconomic History   Marital status: Married  Spouse name: Not on file   Number of children: Not on file   Years of education: Not on file   Highest education level: Not on file  Occupational History   Not on file  Tobacco Use   Smoking status: Former    Current packs/day: 0.00    Types: Cigarettes    Start date: 11/28/1987    Quit date: 11/28/2007    Years since quitting: 16.3   Smokeless tobacco: Former    Types: Snuff    Quit date: 11/27/2009  Vaping Use   Vaping status: Never Used  Substance and Sexual Activity   Alcohol use: Yes    Alcohol/week: 16.0 - 18.0 standard drinks of alcohol     Types: 14 - 16 Standard drinks or equivalent, 2 Cans of beer per week    Comment: 2-4 drinks per day   Drug use: No   Sexual activity: Yes  Other Topics Concern   Not on file  Social History Narrative   Not on file   Social Drivers of Health   Financial Resource Strain: Not on file  Food Insecurity: Not on file  Transportation Needs: Not on file  Physical Activity: Not on file  Stress: Not on file  Social Connections: Unknown (01/18/2022)   Received from [employer]   Social Network    Social Network: Not on file   Lives in a house. Smoking: from 1984 to 2009 Occupation: supervising veterans service rep  Environmental History: Water  Damage/mildew in the house: no Carpet in the family room: yes Carpet in the bedroom: no Heating: electric and gas Cooling: central Pet: yes 1 cat   Family History: Family History  Problem Relation Age of Onset   Anemia Mother    Hyperlipidemia Father    Kassie Parkinson White syndrome Brother 39   Alzheimer's disease Maternal Grandmother    Heart disease Maternal Grandfather    Fainting Maternal Grandfather    Transient ischemic attack Paternal Grandmother    Transient ischemic attack Paternal Grandfather    Problem                               Relation Asthma                                   no Eczema                                no Food allergy                          no Allergic rhino conjunctivitis     no  Review of Systems  Constitutional:  Negative for appetite change, chills, fever and unexpected weight change.  HENT:  Negative for congestion and rhinorrhea.   Eyes:  Negative for itching.  Respiratory:  Negative for cough, chest tightness, shortness of breath and wheezing.   Cardiovascular:  Negative for chest pain.  Gastrointestinal:  Negative for abdominal pain.  Genitourinary:  Negative for difficulty urinating.  Skin:  Negative for rash.  Neurological:  Negative for headaches.    Objective: BP 110/60 (BP  Location: Left Arm, Patient Position: Sitting, Cuff Size: Normal)   Pulse 70   Temp 99.2 F (37.3 C) (Temporal)   Resp 18  Ht 5' 10.87 (1.8 m)   Wt 139 lb (63 kg)   SpO2 98%   BMI 19.46 kg/m  Body mass index is 19.46 kg/m. Physical Exam Vitals and nursing note reviewed.  Constitutional:      Appearance: Normal appearance. He is well-developed.  HENT:     Head: Normocephalic and atraumatic.     Right Ear: Tympanic membrane and external ear normal.     Left Ear: Tympanic membrane and external ear normal.     Nose: Nose normal.     Mouth/Throat:     Mouth: Mucous membranes are moist.     Pharynx: Oropharynx is clear.  Eyes:     Conjunctiva/sclera: Conjunctivae normal.  Cardiovascular:     Rate and Rhythm: Normal rate and regular rhythm.     Heart sounds: Normal heart sounds. No murmur heard.    No friction rub. No gallop.  Pulmonary:     Effort: Pulmonary effort is normal.     Breath sounds: Normal breath sounds. No wheezing, rhonchi or rales.  Musculoskeletal:     Cervical back: Neck supple.  Skin:    General: Skin is warm.     Findings: No rash.  Neurological:     Mental Status: He is alert and oriented to person, place, and time.  Psychiatric:        Behavior: Behavior normal.    The plan was reviewed with the patient/family, and all questions/concerned were addressed.  It was my pleasure to see Kiano today and participate in his care. Please feel free to contact me with any questions or concerns.  Sincerely,  Orlan Cramp, DO Allergy & Immunology  [facility]  [place] office: [phone] [facility] [place] office: [phone]

## 2024-03-30 ENCOUNTER — Other Ambulatory Visit: Payer: Self-pay

## 2024-03-30 ENCOUNTER — Ambulatory Visit: Payer: Self-pay | Admitting: Allergy

## 2024-03-30 ENCOUNTER — Encounter: Payer: Self-pay | Admitting: Allergy

## 2024-03-30 VITALS — BP 110/60 | HR 70 | Temp 99.2°F | Resp 18 | Ht 70.87 in | Wt 139.0 lb

## 2024-03-30 DIAGNOSIS — T63481A Toxic effect of venom of other arthropod, accidental (unintentional), initial encounter: Secondary | ICD-10-CM

## 2024-03-30 DIAGNOSIS — E7800 Pure hypercholesterolemia, unspecified: Secondary | ICD-10-CM | POA: Diagnosis not present

## 2024-03-30 DIAGNOSIS — T63481D Toxic effect of venom of other arthropod, accidental (unintentional), subsequent encounter: Secondary | ICD-10-CM | POA: Diagnosis not present

## 2024-03-30 DIAGNOSIS — Z0000 Encounter for general adult medical examination without abnormal findings: Secondary | ICD-10-CM | POA: Diagnosis not present

## 2024-03-30 DIAGNOSIS — Z125 Encounter for screening for malignant neoplasm of prostate: Secondary | ICD-10-CM | POA: Diagnosis not present

## 2024-03-30 DIAGNOSIS — Z23 Encounter for immunization: Secondary | ICD-10-CM | POA: Diagnosis not present

## 2024-03-30 NOTE — Patient Instructions (Addendum)
 Insect sting Continue to avoid.  Get bloodwork If significantly positive - consider venom immunotherapy. If negative - monitor symptoms. If you notice worsening reactions with subsequent stings then let us  know.  We are ordering labs, so please allow 1-2 weeks for the results to come back. With the newly implemented Cures Act, the labs might be visible to you at the same time that they become visible to me. However, I will not address the results until all of the results are back, so please be patient.  In the meantime, continue recommendations in your patient instructions, including avoidance measures (if applicable), until you hear from me.  For mild symptoms you can take over the counter antihistamines (zyrtec 10mg  to 20mg ) and monitor symptoms closely.  If symptoms worsen or if you have severe symptoms including breathing issues, throat closure, significant swelling, whole body hives, severe diarrhea and vomiting, lightheadedness then seek immediate medical care.  No indication for an epinephrine  device at this time given your localized only reactions you had in the past.   Follow up depending on lab results.

## 2024-04-03 LAB — HYMENOPTERA VENOM ALLERGY II
Bumblebee: <0.1 kU/L
Hornet, White Face, IgE: 0.2 kU/L — AB
Hornet, Yellow, IgE: <0.1 kU/L
I001-IgE Honeybee: <0.1 kU/L
I003-IgE Yellow Jacket: 4.23 kU/L — AB
I004-IgE Paper Wasp: <0.1 kU/L
I208-IgE Api m 1: <0.1 kU/L
I209-IgE Ves v 5: 0.33 kU/L — AB
I210-IgE Pol d 5: 0.11 kU/L — AB
I211-IgE Ves v 1: <0.1 kU/L
I214-IgE Api m 2: <0.1 kU/L
I215-IgE Api m 3: <0.1 kU/L
I216-IgE Api m 5: <0.1 kU/L
I217-IgE Api m 10: <0.1 kU/L
Tryptase: 6.8 ug/L (ref 2.2–13.2)

## 2024-04-03 LAB — ALLERGEN FIRE ANT: I070-IgE Fire Ant (Invicta): <0.1 kU/L

## 2024-04-03 LAB — ALLERGEN COMPONENT COMMENTS

## 2024-04-05 ENCOUNTER — Ambulatory Visit: Payer: Self-pay | Admitting: Allergy

## 2024-04-05 MED ORDER — EPINEPHRINE 0.3 MG/0.3ML IJ SOAJ: 0.3000 mg | INTRAMUSCULAR | 1 refills | Status: AC | PRN

## 2024-04-28 DIAGNOSIS — L820 Inflamed seborrheic keratosis: Secondary | ICD-10-CM | POA: Diagnosis not present

## 2024-05-21 DIAGNOSIS — M7712 Lateral epicondylitis, left elbow: Secondary | ICD-10-CM | POA: Diagnosis not present

## 2024-06-12 DIAGNOSIS — R2232 Localized swelling, mass and lump, left upper limb: Secondary | ICD-10-CM | POA: Diagnosis not present

## 2024-06-16 ENCOUNTER — Other Ambulatory Visit (HOSPITAL_BASED_OUTPATIENT_CLINIC_OR_DEPARTMENT_OTHER): Payer: Self-pay | Admitting: Family Medicine

## 2024-06-16 DIAGNOSIS — R2232 Localized swelling, mass and lump, left upper limb: Secondary | ICD-10-CM

## 2024-06-18 ENCOUNTER — Ambulatory Visit (HOSPITAL_BASED_OUTPATIENT_CLINIC_OR_DEPARTMENT_OTHER)
Admission: RE | Admit: 2024-06-18 | Discharge: 2024-06-18 | Disposition: A | Discharge status: Home / Self Care | Source: Ambulatory Visit | Attending: Family Medicine | Admitting: Family Medicine

## 2024-06-18 DIAGNOSIS — R2232 Localized swelling, mass and lump, left upper limb: Secondary | ICD-10-CM | POA: Diagnosis not present
